# Patient Record
Sex: Female | Born: 1970 | Race: White | Hispanic: No | Marital: Married | Smoking: Current every day smoker
Health system: Southern US, Community
[De-identification: ages and names within clinical notes are randomized; demographics above are authoritative.]

## PROBLEM LIST (undated history)

## (undated) DIAGNOSIS — M1990 Unspecified osteoarthritis, unspecified site: Secondary | ICD-10-CM

## (undated) DIAGNOSIS — C50912 Malignant neoplasm of unspecified site of left female breast: Secondary | ICD-10-CM

## (undated) DIAGNOSIS — R87619 Unspecified abnormal cytological findings in specimens from cervix uteri: Secondary | ICD-10-CM

## (undated) DIAGNOSIS — A609 Anogenital herpesviral infection, unspecified: Secondary | ICD-10-CM

## (undated) DIAGNOSIS — F909 Attention-deficit hyperactivity disorder, unspecified type: Secondary | ICD-10-CM

## (undated) DIAGNOSIS — I471 Supraventricular tachycardia, unspecified: Secondary | ICD-10-CM

## (undated) DIAGNOSIS — M5090 Cervical disc disorder, unspecified, unspecified cervical region: Secondary | ICD-10-CM

## (undated) DIAGNOSIS — C50919 Malignant neoplasm of unspecified site of unspecified female breast: Secondary | ICD-10-CM

## (undated) HISTORY — DX: Cervical disc disorder, unspecified, unspecified cervical region: M50.90

## (undated) HISTORY — DX: Unspecified abnormal cytological findings in specimens from cervix uteri: R87.619

## (undated) HISTORY — DX: Supraventricular tachycardia: I47.1

## (undated) HISTORY — DX: Anogenital herpesviral infection, unspecified: A60.9

## (undated) HISTORY — DX: Supraventricular tachycardia, unspecified: I47.10

---

## 1998-12-02 ENCOUNTER — Other Ambulatory Visit: Admission: RE | Admit: 1998-12-02 | Discharge: 1998-12-02 | Payer: Self-pay | Admitting: Gynecology

## 2000-04-19 ENCOUNTER — Other Ambulatory Visit: Admission: RE | Admit: 2000-04-19 | Discharge: 2000-04-19 | Payer: Self-pay | Admitting: Gynecology

## 2000-05-18 ENCOUNTER — Other Ambulatory Visit: Admission: RE | Admit: 2000-05-18 | Discharge: 2000-05-18 | Payer: Self-pay | Admitting: Gynecology

## 2000-05-18 ENCOUNTER — Encounter (INDEPENDENT_AMBULATORY_CARE_PROVIDER_SITE_OTHER): Payer: Self-pay

## 2000-05-30 ENCOUNTER — Encounter (INDEPENDENT_AMBULATORY_CARE_PROVIDER_SITE_OTHER): Payer: Self-pay

## 2000-05-30 ENCOUNTER — Ambulatory Visit (HOSPITAL_COMMUNITY): Admission: RE | Admit: 2000-05-30 | Discharge: 2000-05-30 | Payer: Self-pay | Admitting: Gynecology

## 2000-07-25 ENCOUNTER — Other Ambulatory Visit: Admission: RE | Admit: 2000-07-25 | Discharge: 2000-07-25 | Payer: Self-pay | Admitting: Gynecology

## 2001-02-20 ENCOUNTER — Other Ambulatory Visit: Admission: RE | Admit: 2001-02-20 | Discharge: 2001-02-20 | Payer: Self-pay | Admitting: Gynecology

## 2001-03-10 ENCOUNTER — Emergency Department (HOSPITAL_COMMUNITY): Admission: EM | Admit: 2001-03-10 | Discharge: 2001-03-10 | Payer: Self-pay | Admitting: Emergency Medicine

## 2001-09-21 ENCOUNTER — Other Ambulatory Visit: Admission: RE | Admit: 2001-09-21 | Discharge: 2001-09-21 | Payer: Self-pay | Admitting: Gynecology

## 2002-10-01 ENCOUNTER — Other Ambulatory Visit: Admission: RE | Admit: 2002-10-01 | Discharge: 2002-10-01 | Payer: Self-pay | Admitting: Gynecology

## 2004-07-25 ENCOUNTER — Emergency Department (HOSPITAL_COMMUNITY): Admission: EM | Admit: 2004-07-25 | Discharge: 2004-07-25 | Payer: Self-pay | Admitting: *Deleted

## 2004-07-26 ENCOUNTER — Emergency Department (HOSPITAL_COMMUNITY): Admission: EM | Admit: 2004-07-26 | Discharge: 2004-07-26 | Payer: Self-pay | Admitting: Emergency Medicine

## 2004-07-28 ENCOUNTER — Emergency Department (HOSPITAL_COMMUNITY): Admission: EM | Admit: 2004-07-28 | Discharge: 2004-07-28 | Payer: Self-pay | Admitting: Family Medicine

## 2005-06-14 DIAGNOSIS — A609 Anogenital herpesviral infection, unspecified: Secondary | ICD-10-CM

## 2005-06-14 HISTORY — DX: Anogenital herpesviral infection, unspecified: A60.9

## 2007-03-07 ENCOUNTER — Other Ambulatory Visit: Admission: RE | Admit: 2007-03-07 | Discharge: 2007-03-07 | Payer: Self-pay | Admitting: Gynecology

## 2007-10-16 ENCOUNTER — Encounter: Admission: RE | Admit: 2007-10-16 | Discharge: 2007-10-16 | Payer: Self-pay | Admitting: Family Medicine

## 2010-10-30 NOTE — Op Note (Signed)
[facility]  Patient:    Heidi Nolan, Heidi Nolan                    MRN: [id number] Adm. 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  POSTOPERATIVE DIAGNOSES 1. Cervical intraepithelial neoplasia, grade 3, extensive at colposcopically    directed biopsies. 2. Closure of incidental incision, lateral vaginal wall.  PROCEDURE:  SURGEON:  Gretta Cool, M.D.  DESCRIPTION OF PROCEDURE:  Under excellent paracervical block anesthesia, with the patient prepped and draped in the lithotomy position, a speculum was placed in the vagina for LEEP cone.  A coated speculum was used so as to prevent inadvertent contact with the electrode.  After carefully outlining the lesion with Lugols iodine and paracervical block, a LEEP cone was obtained. The lateral vaginal wall on the right bulged into the vaginal canal.  It was inadvertently touched with the loop.  The patient then jumped and further extended the entry of the loop into the lateral vaginal wall for a distance of approximately 0.5 cm.  The lateral vaginal wall was then closed with a running suture of 4-0 Vicryl, approximately 0.5 cm deep and 1.0 cm in length. Bleeding was well-controlled.  The LEEP cone bed was then treated with Monsel solution and the cervix bleeding was also well-controlled.  At this point, the procedure was terminated without further complication and patient returned to the recovery room in excellent condition.DD:  05/30/00 TD:  05/31/00 Job: [id number]

## 2012-08-30 ENCOUNTER — Encounter: Payer: Self-pay | Admitting: Obstetrics and Gynecology

## 2012-08-30 ENCOUNTER — Ambulatory Visit (INDEPENDENT_AMBULATORY_CARE_PROVIDER_SITE_OTHER): Payer: 59 | Admitting: Obstetrics and Gynecology

## 2012-08-30 ENCOUNTER — Other Ambulatory Visit: Payer: Self-pay | Admitting: Obstetrics and Gynecology

## 2012-08-30 VITALS — BP 122/80 | Ht 65.0 in | Wt 132.0 lb

## 2012-08-30 DIAGNOSIS — Z0000 Encounter for general adult medical examination without abnormal findings: Secondary | ICD-10-CM

## 2012-08-30 DIAGNOSIS — Z01419 Encounter for gynecological examination (general) (routine) without abnormal findings: Secondary | ICD-10-CM

## 2012-08-30 DIAGNOSIS — A609 Anogenital herpesviral infection, unspecified: Secondary | ICD-10-CM

## 2012-08-30 DIAGNOSIS — B009 Herpesviral infection, unspecified: Secondary | ICD-10-CM

## 2012-08-30 LAB — POCT URINALYSIS DIPSTICK
Bilirubin, UA: NEGATIVE
Protein, UA: NEGATIVE
Urobilinogen, UA: NEGATIVE
pH, UA: 7

## 2012-08-30 LAB — HEMOGLOBIN, FINGERSTICK: Hemoglobin, fingerstick: 12.6 g/dL (ref 12.0–16.0)

## 2012-08-30 LAB — HEMOGLOBIN
HGB: 12.6 g/dL
HGB: 12.6 g/dL

## 2012-08-30 MED ORDER — NORETHINDRONE 0.35 MG PO TABS: 1.0000 | ORAL_TABLET | Freq: Every day | ORAL | Status: DC

## 2012-08-30 MED ORDER — VALACYCLOVIR HCL 500 MG PO TABS: 500.0000 mg | ORAL_TABLET | Freq: Two times a day (BID) | ORAL | Status: DC

## 2012-08-30 NOTE — Progress Notes (Signed)
42 y.o. SingleCaucasian female   G1P1001 here for new gyn exam.  She has not been to a gyn in "a long time" and had some DUB after she had an abortion 2 months ago.  About one month ago, she passed a "piece of tissue" and now she hasn't been having abnormal bleeding.  She started on Lo Loestrin after the abortion but finds it very expensive.  She has a h/o genital HSV, and wold like to restart Valtrex.  Her lesions were originally diagnosed in 2007 by Dr. Nicholas Lose when she had a "rash" on her bottom, and she does have intermittant recurrences of this"rash".      Patient's last menstrual period was 08/13/2012.          Sexually active: yes  The current method of family planning is OCP (estrogen/progesterone).    Exercising: no  none Last mammogram:   Smoking: yes Alcohol: Last colonoscopy: Last Bone Density:    Hgb:                Urine:   reports that she has been smoking Cigarettes.  She has been smoking about 0.00 packs per day. She does not have any smokeless tobacco history on file. She reports that she drinks about 2.5 ounces of alcohol per week. She reports that she does not use illicit drugs.  No health maintenance topics applied.  Family History  Problem Relation Age of Onset  . Osteoarthritis Mother   . Heart attack Father   . Cancer Paternal Grandfather     There is no problem list on file for this patient.   History reviewed. No pertinent past medical history.  Past Surgical History  Procedure Laterality Date  . Cesarean section  1991    Allergies: Review of patient's allergies indicates no known allergies.  Current Outpatient Prescriptions  Medication Sig Dispense Refill  . cetirizine (ZYRTEC) 10 MG tablet Take 10 mg by mouth daily.      Marland Kitchen CHERATUSSIN AC 100-10 MG/5ML syrup       . LO LOESTRIN FE 1 MG-10 MCG / 10 MCG tablet       . VENTOLIN HFA 108 (90 BASE) MCG/ACT inhaler        No current facility-administered medications for this visit.    ROS: Pertinent  items are noted in HPI.  Exam:    BP 122/80  Ht 5\' 5"  (1.651 m)  Wt 132 lb (59.875 kg)  BMI 21.97 kg/m2  LMP 08/13/2012   Wt Readings from Last 3 Encounters:  08/30/12 132 lb (59.875 kg)     Ht Readings from Last 3 Encounters:  08/30/12 5\' 5"  (1.651 m)    General appearance: alert, cooperative and appears stated age Head: Normocephalic, without obvious abnormality, atraumatic Neck: no adenopathy, supple, symmetrical, trachea midline and thyroid not enlarged, symmetric, no tenderness/mass/nodules Lungs: clear to auscultation bilaterally Breasts: Inspection negative, No nipple retraction or dimpling, No nipple discharge or bleeding, No axillary or supraclavicular adenopathy, Normal to palpation without dominant masses Heart: regular rate and rhythm Abdomen: soft, non-tender; bowel sounds normal; no masses,  no organomegaly Extremities: extremities normal, atraumatic, no cyanosis or edema Skin: Skin color, texture, turgor normal. No rashes or lesions Lymph nodes: Cervical, supraclavicular, and axillary nodes normal. No abnormal inguinal nodes palpated Neurologic: Grossly normal   Pelvic: External genitalia:  no lesions              Urethra:  normal appearing urethra with no masses, tenderness or lesions  Bartholins and Skenes: normal                 Vagina: normal appearing vagina with normal color and discharge, no lesions              Cervix: normal appearance              Pap taken: yes        Bimanual Exam:  Uterus:  uterus is normal size, shape, consistency and nontender                                      Adnexa: normal adnexa in size, nontender and no masses                                      Rectovaginal: Confirms                                      Anus:  normal sphincter tone, no lesions  A: well woman      Genital HSV      Smoker, currently on ocp's.     P: mammogram pap smear counseled on breast self exam, mammography screening, use and side  effects of OCP's, family planning choices, adequate intake of calcium and vitamin D, diet and exercise return annually or prn   Discussed that I do NOT recommend ocp's for her d/t smoking and age 46.  Discussed options and pt elects POP.  Fully instructed in [facility]ronor.    An After Visit Summary was printed and given to the patient.

## 2012-08-30 NOTE — Patient Instructions (Signed)
We recommended that you start or continue a regular exercise program for good health. Regular exercise means any activity that makes your heart beat faster and makes you sweat.  We recommend exercising at least 30 minutes per day at least 3 days a week, preferably 4 or 5.  We also recommend a diet low in fat and sugar.  Inactivity, poor dietary choices and obesity can cause diabetes, heart attack, stroke, and kidney damage, among others.    All women over 40 years old should have a yearly mammogram. Many facilities now offer a "3D" mammogram, which may cost around $50 extra out of pocket. If possible,  we recommend you accept the option to have the 3D mammogram performed.  It both reduces the number of women who will be called back for extra views which then turn out to be normal, and it is better than the routine mammogram at detecting truly abnormal areas.    Women should limit their alcohol intake to no more than 7 drinks/beers/glasses of wine (combined, not each!) per week. Moderation of alcohol intake to this level decreases your risk of breast cancer and liver damage. And of course, no recreational drugs are part of a healthy lifestyle.  And absolutely no smoking or even second hand smoke. Most people know smoking can cause heart and lung diseases, but did you know it also contributes to weakening of your bones? Aging of your skin?  Yellowing of your teeth and nails?  Pap smears, to check for cervical cancer or precancers,  have traditionally been done yearly, although recent scientific advances have shown that most women can have pap smears less often.  However, every woman still should have a physical exam from her gynecologist every year. It will include a breast check, inspection of the vulva and vagina to check for abnormal growths or skin changes, a visual exam of the cervix, and then an exam to evaluate the size and shape of the uterus and ovaries.  And after 40 years of age, a rectal exam is  indicated to check for rectal cancers. We will also provide age appropriate advice regarding health maintenance, like when you should have certain vaccines, screening for sexually transmitted diseases, bone density testing, colonoscopy, mammograms, etc.   Adequate intake of calcium and Vitamin D are recommended.  The recommendations for exact amounts of these supplements seem to change often, but generally speaking 600 mg of calcium (either carbonate or citrate) and 800 units of Vitamin D per day seems prudent. Certain women may benefit from higher intake of Vitamin D.  If you are among these women, your doctor will have told you during your visit.     

## 2012-09-15 ENCOUNTER — Encounter: Payer: Self-pay | Admitting: Obstetrics and Gynecology

## 2012-11-17 ENCOUNTER — Other Ambulatory Visit: Payer: Self-pay | Admitting: Orthopedic Surgery

## 2012-11-17 DIAGNOSIS — M542 Cervicalgia: Secondary | ICD-10-CM

## 2012-11-21 ENCOUNTER — Ambulatory Visit
Admission: RE | Admit: 2012-11-21 | Discharge: 2012-11-21 | Disposition: A | Discharge status: Home / Self Care | Payer: 59 | Source: Ambulatory Visit | Attending: Orthopedic Surgery | Admitting: Orthopedic Surgery

## 2012-11-21 VITALS — BP 129/85 | HR 61

## 2012-11-21 DIAGNOSIS — M542 Cervicalgia: Secondary | ICD-10-CM

## 2012-11-21 MED ORDER — IOHEXOL 300 MG/ML  SOLN
1.0000 mL | Freq: Once | INTRAMUSCULAR | Status: AC | PRN
  Administered 2012-11-21: 1 mL via EPIDURAL

## 2012-11-21 MED ORDER — TRIAMCINOLONE ACETONIDE 40 MG/ML IJ SUSP (RADIOLOGY)
60.0000 mg | Freq: Once | INTRAMUSCULAR | Status: AC
  Administered 2012-11-21: 60 mg via EPIDURAL

## 2012-12-27 ENCOUNTER — Telehealth: Payer: Self-pay | Admitting: Obstetrics and Gynecology

## 2012-12-27 NOTE — Telephone Encounter (Signed)
Patient wants to speak with nurse about irregular cycles please.

## 2012-12-27 NOTE — Telephone Encounter (Signed)
Patient states having irregular cycles and BTB with cramping/ Appointment scheduled with Dr,. Romine on Friday,July 18th @ 9:30am/ sue Last AEX 08/2012 with no problems.

## 2012-12-29 ENCOUNTER — Ambulatory Visit (INDEPENDENT_AMBULATORY_CARE_PROVIDER_SITE_OTHER): Payer: 59 | Admitting: Obstetrics and Gynecology

## 2012-12-29 ENCOUNTER — Encounter: Payer: Self-pay | Admitting: Obstetrics and Gynecology

## 2012-12-29 VITALS — BP 104/70 | HR 64 | Temp 98.8°F | Ht 65.0 in | Wt 129.0 lb

## 2012-12-29 DIAGNOSIS — N920 Excessive and frequent menstruation with regular cycle: Secondary | ICD-10-CM

## 2012-12-29 NOTE — Patient Instructions (Signed)
Return if bleeding is heavy or persistant.  Continue routine care next March.

## 2012-12-29 NOTE — Progress Notes (Signed)
42 yo SWF G1P1 seen here in March for her New GYN exam.  At that time, she was changed from COC's to POP's d/t being a smoker and age 53.  She only took the POP's for one month b/c she couldn't afford them.  They were $40 a month! She is not currently sexually active, so doesn't need birth control.  She c/o spotting on and off for the last week.  Since quitting POP's, she has had 3 normal periods, but this month, she is now on d18 and note one week of light spotting, not even enough to use a tampon, but she wears a panty liner.  No concern about STD's.    Exam:  Ext, vag, cx normal.  BM:  Uterus ant, small, mobile , NT.  Adnexa neg.  A:  Single episode of spotting between menses  P:  Reassured that I do not think any intervention is warranted at this point.  Instructed to return if bleeding is persistent or heavy.

## 2013-02-12 HISTORY — PX: NECK SURGERY: SHX720

## 2013-03-01 DIAGNOSIS — M4802 Spinal stenosis, cervical region: Secondary | ICD-10-CM | POA: Insufficient documentation

## 2013-03-09 ENCOUNTER — Encounter: Payer: Self-pay | Admitting: Obstetrics & Gynecology

## 2013-08-31 ENCOUNTER — Ambulatory Visit (INDEPENDENT_AMBULATORY_CARE_PROVIDER_SITE_OTHER): Payer: 59 | Admitting: Obstetrics & Gynecology

## 2013-08-31 ENCOUNTER — Encounter: Payer: Self-pay | Admitting: Obstetrics & Gynecology

## 2013-08-31 ENCOUNTER — Ambulatory Visit: Payer: 59 | Admitting: Obstetrics and Gynecology

## 2013-08-31 VITALS — BP 108/64 | HR 60 | Resp 16 | Ht 65.0 in | Wt 133.4 lb

## 2013-08-31 DIAGNOSIS — B009 Herpesviral infection, unspecified: Secondary | ICD-10-CM

## 2013-08-31 DIAGNOSIS — A609 Anogenital herpesviral infection, unspecified: Secondary | ICD-10-CM

## 2013-08-31 DIAGNOSIS — K625 Hemorrhage of anus and rectum: Secondary | ICD-10-CM

## 2013-08-31 DIAGNOSIS — Z01419 Encounter for gynecological examination (general) (routine) without abnormal findings: Secondary | ICD-10-CM

## 2013-08-31 DIAGNOSIS — Z124 Encounter for screening for malignant neoplasm of cervix: Secondary | ICD-10-CM

## 2013-08-31 MED ORDER — VALACYCLOVIR HCL 500 MG PO TABS: 500.0000 mg | ORAL_TABLET | Freq: Two times a day (BID) | ORAL | Status: DC

## 2013-08-31 NOTE — Patient Instructions (Signed)
Colace (docusate sodium) 100mg  am/pm Miralax--can use daily align

## 2013-08-31 NOTE — Progress Notes (Signed)
43 y.o. G2P1001 SingleCaucasianF here for annual exam.  Started on micronor in July.  Spotting almost daily.  She is really having trouble with taking the pills regularly.  Feels if she wasn't taking the pills she would just cycle regularly.  Having issues with significant constipation.  Can go four or five days without BM.  Often has bright red bleeding after bowel movement.  Does need to strain.  Just had physical with Dr. Wylene Simmerisovec last month.  Lab work was all normal, per patient.  Patient's last menstrual period was 08/12/2013.          Sexually active: yes  The current method of family planning is oral progesterone-only contraceptive.    Exercising: yes  yoga-just started and walking Smoker:  Yes one pack/week  Health Maintenance: Pap:  08/30/12 WNL/negative HR HPV History of abnormal Pap:  yes MMG:  none Colonoscopy:  none BMD:   none TDaP:  Up to date Screening Labs: PCP, Hb today: PCP, Urine today: PCP   reports that she has been smoking Cigarettes.  She has been smoking about 0.00 packs per day. She has never used smokeless tobacco. She reports that she drinks about 2.5 ounces of alcohol per week. She reports that she does not use illicit drugs.  Past Medical History  Diagnosis Date  . HSV (herpes simplex virus) anogenital infection 2007  . Abnormal Pap smear of cervix     Past Surgical History  Procedure Laterality Date  . Cesarean section  1991  . Neck surgery  9/14    Current Outpatient Prescriptions  Medication Sig Dispense Refill  . cetirizine (ZYRTEC) 10 MG tablet Take 10 mg by mouth daily.      . ORTHO MICRONOR 0.35 MG tablet       . valACYclovir (VALTREX) 500 MG tablet Take 1 tablet (500 mg total) by mouth 2 (two) times daily. Take as directed.  12 tablet  1  . VENTOLIN HFA 108 (90 BASE) MCG/ACT inhaler        No current facility-administered medications for this visit.    Family History  Problem Relation Age of Onset  . Osteoarthritis Mother   . Heart  attack Father   . Cancer Paternal Grandfather     ROS:  Pertinent items are noted in HPI.  Otherwise, a comprehensive ROS was negative.  Exam:   BP 108/64  Pulse 60  Resp 16  Ht 5\' 5"  (1.651 m)  Wt 133 lb 6.4 oz (60.51 kg)  BMI 22.20 kg/m2  LMP 08/12/2013  Height: 5\' 5"  (165.1 cm)  Ht Readings from Last 3 Encounters:  08/31/13 5\' 5"  (1.651 m)  12/29/12 5\' 5"  (1.651 m)  08/30/12 5\' 5"  (1.651 m)    General appearance: alert, cooperative and appears stated age Head: Normocephalic, without obvious abnormality, atraumatic Neck: no adenopathy, supple, symmetrical, trachea midline and thyroid normal to inspection and palpation Lungs: clear to auscultation bilaterally Breasts: normal appearance, no masses or tenderness Heart: regular rate and rhythm Abdomen: soft, non-tender; bowel sounds normal; no masses,  no organomegaly Extremities: extremities normal, atraumatic, no cyanosis or edema Skin: Skin color, texture, turgor normal. No rashes or lesions Lymph nodes: Cervical, supraclavicular, and axillary nodes normal. No abnormal inguinal nodes palpated Neurologic: Grossly normal   Pelvic: External genitalia:  no lesions              Urethra:  normal appearing urethra with no masses, tenderness or lesions  Bartholins and Skenes: normal                 Vagina: normal appearing vagina with normal color and discharge, no lesions              Cervix: no lesions              Pap taken: yes Bimanual Exam:  Uterus:  normal size, contour, position, consistency, mobility, non-tender              Adnexa: normal adnexa and no mass, fullness, tenderness               Rectovaginal: Confirms               Anus:  normal sphincter tone, no lesions  A:  Well Woman with normal exam DUB on micronor Constipation, rectal bleeding Smoker H/O HSV  P:   Mammogram yearly.  Information given to start screening now.  Pt states she will schedule pap smear only obtained Stop micronor.  Info  about ablation and IUD given.  Pt will use condoms until decides.  If irregular bleeding continues after stopping micronor, she will call and PUS will be scheduled. GI referral.  Colace and Miralax and Align recommended. Valtrex 500mg  daily rx to pharmacy. return annually or prn  An After Visit Summary was printed and given to the patient.

## 2013-09-05 LAB — IPS PAP SMEAR ONLY

## 2013-09-28 ENCOUNTER — Telehealth: Payer: Self-pay | Admitting: Obstetrics & Gynecology

## 2013-09-28 NOTE — Telephone Encounter (Signed)
Left message for patient to call back. Need to advise patient that she is scheduled with Dr Loreta AveMann 04.30.2015 @ 1330.

## 2013-10-16 ENCOUNTER — Other Ambulatory Visit: Payer: Self-pay

## 2013-10-16 DIAGNOSIS — Z1231 Encounter for screening mammogram for malignant neoplasm of breast: Secondary | ICD-10-CM

## 2013-10-22 ENCOUNTER — Ambulatory Visit
Admission: RE | Admit: 2013-10-22 | Discharge: 2013-10-22 | Disposition: A | Discharge status: Home / Self Care | Payer: 59 | Source: Ambulatory Visit

## 2013-10-22 DIAGNOSIS — Z1231 Encounter for screening mammogram for malignant neoplasm of breast: Secondary | ICD-10-CM

## 2013-11-29 ENCOUNTER — Encounter: Payer: Self-pay | Admitting: Cardiology

## 2014-04-15 ENCOUNTER — Encounter: Payer: Self-pay | Admitting: Obstetrics & Gynecology

## 2014-08-08 ENCOUNTER — Encounter: Payer: Self-pay | Admitting: Cardiology

## 2014-08-16 ENCOUNTER — Telehealth: Payer: Self-pay | Admitting: Obstetrics & Gynecology

## 2014-08-16 NOTE — Telephone Encounter (Signed)
Spoke with patient. Appointment scheduled for 3/8 at 2pm with Dr.Miller (date and time per Kennon RoundsSally). Patient is agreeable to date and time.   Routing to provider for final review. Patient agreeable to disposition. Will close encounter

## 2014-08-16 NOTE — Telephone Encounter (Signed)
Pt hasn't been seen in almost a year.  We should see first before referring to another GI.

## 2014-08-16 NOTE — Telephone Encounter (Signed)
Spoke with patient. Patient states "At my last annual I was having rectal bleeding that was coming from a tear. Dr.Miller referred me to a GI. I think Dr.Mann. Who I saw and was given some cream. I guess they thought it would heal on its own but it has gotten worse. Every time I use the restroom it is terrible and then I am in pain the rest of the day. I did not like the provider I saw before so I did not go back. Is there someone else she could refer me over to?" Advised patient will need to speak with Dr.Miller regarding referral and return call. Patient was last seen for aex on 08/31/2013. Next annual is scheduled for 09/09/2014. Patient also requesting rx for Valtrex be sent to [facility] on file. "I have decided that I would like to take the Valtrex daily now." Patient denies increase in outbreaks at this time. "I just prefer to take it daily." Was last given Valtrex 500mg  bid #6 3RF at last annual.

## 2014-08-16 NOTE — Telephone Encounter (Signed)
Left message to call Breannah Kratt at [phone].   Need to offer patient appointment for Tuesday 3/8 at 2pm with Dr.Miller (day and time per Kennon RoundsSally).

## 2014-08-16 NOTE — Telephone Encounter (Signed)
Patient wants to schedule an appointment for a "tear down there." She declined to give more information until speaking with the nurse.

## 2014-08-20 ENCOUNTER — Telehealth: Payer: Self-pay | Admitting: Obstetrics & Gynecology

## 2014-08-20 ENCOUNTER — Ambulatory Visit (INDEPENDENT_AMBULATORY_CARE_PROVIDER_SITE_OTHER): Payer: 59 | Admitting: Obstetrics & Gynecology

## 2014-08-20 VITALS — BP 126/78 | HR 60 | Resp 16 | Wt 137.2 lb

## 2014-08-20 DIAGNOSIS — K6289 Other specified diseases of anus and rectum: Secondary | ICD-10-CM

## 2014-08-20 DIAGNOSIS — A609 Anogenital herpesviral infection, unspecified: Secondary | ICD-10-CM | POA: Diagnosis not present

## 2014-08-20 DIAGNOSIS — K602 Anal fissure, unspecified: Secondary | ICD-10-CM

## 2014-08-20 DIAGNOSIS — K629 Disease of anus and rectum, unspecified: Secondary | ICD-10-CM

## 2014-08-20 MED ORDER — LIDOCAINE 5 % EX OINT: 1.0000 "application " | TOPICAL_OINTMENT | Freq: Four times a day (QID) | CUTANEOUS | Status: DC | PRN

## 2014-08-20 MED ORDER — VALACYCLOVIR HCL 500 MG PO TABS: 500.0000 mg | ORAL_TABLET | Freq: Every day | ORAL | Status: DC

## 2014-08-20 NOTE — Progress Notes (Signed)
Subjective:     Patient ID: Heidi Nolan, female   DOB: 1970/11/17, 44 y.o.   MRN: [id number]  HPI 44 yo G2P1 SWF here for discussion of increase pain with bowel movements.  Known hx of anal fissure, for which she has seen Dr. Loreta Nolan.  Pt hasn't seen her in several years and called for a referral back to Dr. Loreta Nolan.  Does have an old rx of cardizem topical gel to use on the fissure.  Has been using it but it is so painful she doesn't think she is getting it where it needs to be.  Also, has hx of chronic constipation but doesn't usually take anything for pain.  Not seeing any blood right now.  No fevers.  No recent change in bowel movement.  Another question is in regards to her valtrex.  She is only using is for outbreaks but is in a new relationship and would like to start it daily to decrease risk for partner.  Agree with suggestion.  Will change pt to 500mg  daily dosage.  Review of Systems  Gastrointestinal: Positive for constipation and rectal pain.  All other systems reviewed and are negative.      Objective:   Physical Exam  Constitutional: She is oriented to person, place, and time. She appears well-developed and well-nourished.  Abdominal: Soft. Bowel sounds are normal.  Genitourinary: Vagina normal. Rectal exam shows fissure (12-1 o'clock, extremely tender) and tenderness. Rectal exam shows no internal hemorrhoid, no mass and anal tone normal. There is no rash, tenderness, lesion or injury on the right labia. There is no rash, tenderness, lesion or injury on the left labia.  Lymphadenopathy:       Right: No inguinal adenopathy present.       Left: No inguinal adenopathy present.  Neurological: She is alert and oriented to person, place, and time.  Skin: Skin is warm and dry.  Psychiatric: She has a normal mood and affect.       Assessment:     Anal fissure Rectal pain with defecation  H/o HSV 2     Plan:    Lidocaine 5% ointment before Cardizem application.  Rx for  Cardizem gel given as well according to dosage and instructions on bottle from Dr. Loreta Nolan.  Appropriate location for placement in rectum given.  Pt will give update in a month.  If worsens, she is advised to call back sooner so I can get her back to see Dr. Loreta Nolan.   May ultimately need referral back to Dr. Loreta Nolan.  Will wait and see how much improvement she gets.   Desires to start suppressive therapy.  Valtrex 500mg  daily with increasing to BID x 3 days with any outbreaks  Suggestions for constipation made as well with OTC products

## 2014-08-20 NOTE — Telephone Encounter (Signed)
Spoke with Heidi Nolan at [facility]. She states Lidocaine comes in tubes of 30 grams only, no 1.25 gram tube available. Advised 30 grams to be dispensed is okay.  Will now dispense to patient.   Routing to provider for final review. Patient agreeable to disposition. Will close encounter

## 2014-08-20 NOTE — Telephone Encounter (Signed)
Pharmacy calling for clarification on Lidocaine Rx.

## 2014-08-20 NOTE — Patient Instructions (Signed)
Use topical lidocaine ointment 5% 4-5 times daily as needed.  Apply a few minutes before the cardizem.    Stool softeners:  Colace (docusate sodium).  Can take 2 in am and 2 in pm.   Add miralax to this.

## 2014-08-26 ENCOUNTER — Encounter: Payer: Self-pay | Admitting: Obstetrics & Gynecology

## 2014-09-01 ENCOUNTER — Encounter: Payer: Self-pay | Admitting: Obstetrics & Gynecology

## 2014-09-01 DIAGNOSIS — K602 Anal fissure, unspecified: Secondary | ICD-10-CM | POA: Insufficient documentation

## 2014-09-01 DIAGNOSIS — A609 Anogenital herpesviral infection, unspecified: Secondary | ICD-10-CM | POA: Insufficient documentation

## 2014-09-09 ENCOUNTER — Ambulatory Visit: Payer: 59 | Admitting: Obstetrics & Gynecology

## 2014-09-13 ENCOUNTER — Encounter: Payer: Self-pay | Admitting: Obstetrics & Gynecology

## 2014-09-13 ENCOUNTER — Ambulatory Visit (INDEPENDENT_AMBULATORY_CARE_PROVIDER_SITE_OTHER): Payer: 59 | Admitting: Obstetrics & Gynecology

## 2014-09-13 VITALS — BP 100/72 | HR 70 | Ht 65.0 in | Wt 134.8 lb

## 2014-09-13 DIAGNOSIS — Z01419 Encounter for gynecological examination (general) (routine) without abnormal findings: Secondary | ICD-10-CM | POA: Diagnosis not present

## 2014-09-13 DIAGNOSIS — K602 Anal fissure, unspecified: Secondary | ICD-10-CM

## 2014-09-13 DIAGNOSIS — A609 Anogenital herpesviral infection, unspecified: Secondary | ICD-10-CM | POA: Diagnosis not present

## 2014-09-13 MED ORDER — NORETHINDRONE 0.35 MG PO TABS: 1.0000 | ORAL_TABLET | Freq: Every day | ORAL | Status: DC

## 2014-09-13 NOTE — Progress Notes (Signed)
Patient is scheduled for office visit with Dr. Loreta AveMann for 09/24/14 at  1500 (patient choice for date, will be out of town on 7th and 8th). Patient agreeable to appointment.

## 2014-09-13 NOTE — Progress Notes (Deleted)
44 y.o. G2P1001 Single{Race/ethnicity:17218}F here for annual exam.    Patient's last menstrual period was 08/13/2014.          Sexually active: {yes no:314532}  The current method of family planning is {contraception:315051}.    Exercising: {yes no:314532}  {types:19826} Smoker:  {YES NO:22349}  Health Maintenance: Pap:  *** History of abnormal Pap:  {YES NO:22349} MMG:  *** Colonoscopy:  *** BMD:   *** TDaP:  *** Screening Labs: ***, Hb today: ***, Urine today: ***   reports that she has been smoking Cigarettes.  She has never used smokeless tobacco. She reports that she drinks about 2.5 - 3.5 oz of alcohol per week. She reports that she does not use illicit drugs.  Past Medical History  Diagnosis Date  . HSV (herpes simplex virus) anogenital infection 2007  . Abnormal Pap smear of cervix     Past Surgical History  Procedure Laterality Date  . Cesarean section  1991  . Neck surgery  9/14    [facility]    Current Outpatient Prescriptions  Medication Sig Dispense Refill  . cetirizine (ZYRTEC) 10 MG tablet Take 10 mg by mouth daily.    Marland Kitchen. lidocaine (XYLOCAINE) 5 % ointment Apply 1 application topically 4 (four) times daily as needed. 1.25 g 2  . valACYclovir (VALTREX) 500 MG tablet Take 1 tablet (500 mg total) by mouth daily. Take as directed. 90 tablet 4  . VENTOLIN HFA 108 (90 BASE) MCG/ACT inhaler      No current facility-administered medications for this visit.    Family History  Problem Relation Age of Onset  . Osteoarthritis Mother   . Heart attack Father   . Cancer Paternal Grandfather     ROS:  Pertinent items are noted in HPI.  Otherwise, a comprehensive ROS was negative.  Exam:   BP 100/72 mmHg  Pulse 70  Ht 5\' 8"  (1.727 m)  Wt 134 lb 12.8 oz (61.145 kg)  BMI 20.50 kg/m2  LMP 08/13/2014  Weight change: @WEIGHTCHANGE @ Height:   Height: 5\' 8"  (172.7 cm)  Ht Readings from Last 3 Encounters:  09/13/14 5\' 8"  (1.727 m)  08/31/13 5\' 5"  (1.651 m)  12/29/12 5\' 5"   (1.651 m)    General appearance: alert, cooperative and appears stated age Head: Normocephalic, without obvious abnormality, atraumatic Neck: no adenopathy, supple, symmetrical, trachea midline and thyroid {EXAM; THYROID:18604} Lungs: clear to auscultation bilaterally Breasts: {Exam; breast:13139::"normal appearance, no masses or tenderness"} Heart: regular rate and rhythm Abdomen: soft, non-tender; bowel sounds normal; no masses,  no organomegaly Extremities: extremities normal, atraumatic, no cyanosis or edema Skin: Skin color, texture, turgor normal. No rashes or lesions Lymph nodes: Cervical, supraclavicular, and axillary nodes normal. No abnormal inguinal nodes palpated Neurologic: Grossly normal   Pelvic: External genitalia:  no lesions              Urethra:  normal appearing urethra with no masses, tenderness or lesions              Bartholins and Skenes: normal                 Vagina: normal appearing vagina with normal color and discharge, no lesions              Cervix: {exam; cervix:14595}              Pap taken: {yes no:314532} Bimanual Exam:  Uterus:  {exam; uterus:12215}              Adnexa: {  exam; adnexa:12223}               Rectovaginal: Confirms               Anus:  normal sphincter tone, no lesions  Chaperone was present for exam.  A:  Well Woman with normal exam  P:   {plan; gyn:5269::"mammogram","pap smear","return annually or prn"}

## 2014-09-13 NOTE — Progress Notes (Signed)
44 y.o. G2P1001 SingleCaucasianF here for annual exam.  Still having rectal pain with fissure.  Pt has been using Diltiazem and lidocaine without a lot of improvement in the last three week.  Pt reports having really bad episode of constipation last week and she thought she was going to have to go to the ER for pain.    Cycles normal.  Not on Micronor any longer.    PCP:  Dr. Wylene Simmerisovec.  Saw him about a month ago.  Blood work was all normal.    Patient's last menstrual period was 08/13/2014.          Sexually active: Yes.    The current method of family planning is none.    Exercising: Yes.    walking 3-4/xwk Smoker:  Yes, 1 pack/wk  Health Maintenance: Pap:  08/31/13 wnl  History of abnormal Pap:  yes MMG:  10/23/2013 Colonoscopy:  None  BMD:  None  TDaP: up to date Screening Labs:, Labs done by Filiberto Pinksichard Tisvoec, MD , Urine today: Declined   reports that she has been smoking Cigarettes.  She has never used smokeless tobacco. She reports that she drinks about 2.5 - 3.5 oz of alcohol per week. She reports that she does not use illicit drugs.  Past Medical History  Diagnosis Date  . HSV (herpes simplex virus) anogenital infection 2007  . Abnormal Pap smear of cervix     Past Surgical History  Procedure Laterality Date  . Cesarean section  1991  . Neck surgery  9/14    [facility]    Current Outpatient Prescriptions  Medication Sig Dispense Refill  . cetirizine (ZYRTEC) 10 MG tablet Take 10 mg by mouth daily.    Marland Kitchen. lidocaine (XYLOCAINE) 5 % ointment Apply 1 application topically 4 (four) times daily as needed. 1.25 g 2  . valACYclovir (VALTREX) 500 MG tablet Take 1 tablet (500 mg total) by mouth daily. Take as directed. 90 tablet 4  . VENTOLIN HFA 108 (90 BASE) MCG/ACT inhaler      No current facility-administered medications for this visit.    Family History  Problem Relation Age of Onset  . Osteoarthritis Mother   . Heart attack Father   . Cancer Paternal Grandfather     ROS:   Pertinent items are noted in HPI.  Otherwise, a comprehensive ROS was negative.  Exam:   BP 100/72 mmHg  Pulse 70  Ht 5\' 5"  (1.651 m)  Wt 134 lb 12.8 oz (61.145 kg)  BMI 22.43 kg/m2  LMP 08/13/2014   Height: 5\' 5"  (165.1 cm)  Ht Readings from Last 3 Encounters:  09/13/14 5\' 5"  (1.651 m)  08/31/13 5\' 5"  (1.651 m)  12/29/12 5\' 5"  (1.651 m)    General appearance: alert, cooperative and appears stated age Head: Normocephalic, without obvious abnormality, atraumatic Neck: no adenopathy, supple, symmetrical, trachea midline and thyroid normal to inspection and palpation Lungs: clear to auscultation bilaterally Breasts: normal appearance, no masses or tenderness Heart: regular rate and rhythm Abdomen: soft, non-tender; bowel sounds normal; no masses,  no organomegaly Extremities: extremities normal, atraumatic, no cyanosis or edema Skin: Skin color, texture, turgor normal. No rashes or lesions Lymph nodes: Cervical, supraclavicular, and axillary nodes normal. No abnormal inguinal nodes palpated Neurologic: Grossly normal   Pelvic: External genitalia:  no lesions              Urethra:  normal appearing urethra with no masses, tenderness or lesions  Bartholins and Skenes: normal                 Vagina: normal appearing vagina with normal color and discharge, no lesions              Cervix: no lesions              Pap taken: No. Bimanual Exam:  Uterus:  normal size, contour, position, consistency, mobility, non-tender              Adnexa: normal adnexa and no mass, fullness, tenderness               Rectovaginal: Confirms               Anus:  normal sphincter tone, no lesions  Chaperone was present for exam.  A:  Well Woman with normal exam Using no contraception Stopped micronor due to DUB but desires to restart Anal fissure, not improved with diltiazem and lidocaine for last three weeks Smoker H/O HSV  P: Mammogram recommedations discussed with pt.  Pt  will do MMG again next year. Pap with neg HR HPV 2014.  Neg pap 2015.  No pap today. Micronor rx given.  Pt will start with next cycle.  IUD information provided as well On Valtrex  daily.  Rx given three weeks ago for the year. Refer back to GI for anal fissure issues return annually or prn

## 2014-11-04 ENCOUNTER — Ambulatory Visit: Payer: 59 | Admitting: Certified Nurse Midwife

## 2015-02-14 ENCOUNTER — Encounter: Payer: Self-pay | Admitting: Obstetrics & Gynecology

## 2015-11-21 ENCOUNTER — Ambulatory Visit: Payer: 59 | Admitting: Obstetrics & Gynecology

## 2015-12-04 ENCOUNTER — Encounter: Payer: Self-pay | Admitting: Obstetrics & Gynecology

## 2016-06-10 ENCOUNTER — Encounter: Payer: Self-pay | Admitting: Obstetrics & Gynecology

## 2018-03-28 ENCOUNTER — Encounter: Payer: Self-pay | Admitting: Cardiology

## 2018-03-28 ENCOUNTER — Encounter (HOSPITAL_COMMUNITY): Payer: Self-pay | Admitting: *Deleted

## 2018-03-28 ENCOUNTER — Emergency Department (HOSPITAL_COMMUNITY): Payer: BLUE CROSS/BLUE SHIELD

## 2018-03-28 ENCOUNTER — Other Ambulatory Visit: Payer: Self-pay

## 2018-03-28 ENCOUNTER — Emergency Department (HOSPITAL_BASED_OUTPATIENT_CLINIC_OR_DEPARTMENT_OTHER): Payer: BLUE CROSS/BLUE SHIELD

## 2018-03-28 ENCOUNTER — Emergency Department (HOSPITAL_COMMUNITY)
Admission: EM | Admit: 2018-03-28 | Discharge: 2018-03-28 | Disposition: A | Discharge status: Home / Self Care | Payer: BLUE CROSS/BLUE SHIELD | Attending: Emergency Medicine | Admitting: Emergency Medicine

## 2018-03-28 DIAGNOSIS — R52 Pain, unspecified: Secondary | ICD-10-CM

## 2018-03-28 DIAGNOSIS — Z79899 Other long term (current) drug therapy: Secondary | ICD-10-CM | POA: Diagnosis not present

## 2018-03-28 DIAGNOSIS — R079 Chest pain, unspecified: Secondary | ICD-10-CM

## 2018-03-28 DIAGNOSIS — M79606 Pain in leg, unspecified: Secondary | ICD-10-CM | POA: Diagnosis not present

## 2018-03-28 DIAGNOSIS — F17210 Nicotine dependence, cigarettes, uncomplicated: Secondary | ICD-10-CM | POA: Diagnosis not present

## 2018-03-28 DIAGNOSIS — Z7982 Long term (current) use of aspirin: Secondary | ICD-10-CM | POA: Diagnosis not present

## 2018-03-28 DIAGNOSIS — R0789 Other chest pain: Secondary | ICD-10-CM | POA: Diagnosis not present

## 2018-03-28 LAB — BASIC METABOLIC PANEL
ANION GAP: 6 (ref 5–15)
BUN: 8 mg/dL (ref 6–20)
CHLORIDE: 105 mmol/L (ref 98–111)
CO2: 25 mmol/L (ref 22–32)
Calcium: 9.4 mg/dL (ref 8.9–10.3)
Creatinine, Ser: 0.58 mg/dL (ref 0.44–1.00)
GFR calc Af Amer: >60 mL/min (ref >60)
GFR calc non Af Amer: >60 mL/min (ref >60)
GLUCOSE: 113 mg/dL — AB (ref 70–99)
POTASSIUM: 4.9 mmol/L (ref 3.5–5.1)
Sodium: 136 mmol/L (ref 135–145)

## 2018-03-28 LAB — CBC
HCT: 43.4 % (ref 36.0–46.0)
HEMOGLOBIN: 13.8 g/dL (ref 12.0–15.0)
MCH: 30.7 pg (ref 26.0–34.0)
MCHC: 31.8 g/dL (ref 30.0–36.0)
MCV: 96.4 fL (ref 80.0–100.0)
Platelets: 313 10*3/uL (ref 150–400)
RBC: 4.5 MIL/uL (ref 3.87–5.11)
RDW: 13.4 % (ref 11.5–15.5)
WBC: 4.6 10*3/uL (ref 4.0–10.5)
nRBC: 0 % (ref 0.0–0.2)

## 2018-03-28 LAB — I-STAT BETA HCG BLOOD, ED (MC, WL, AP ONLY): I-stat hCG, quantitative: <5 m[IU]/mL (ref <5)

## 2018-03-28 LAB — I-STAT TROPONIN, ED: Troponin i, poc: 0 ng/mL (ref 0.00–0.08)

## 2018-03-28 NOTE — ED Provider Notes (Signed)
[facility] EMERGENCY DEPARTMENT Provider Note   CSN: [id number] Arrival date & time: 03/28/18  1105     History   Chief Complaint Chief Complaint  Patient presents with  . Leg Pain  . Chest Pain    HPI Heidi Nolan is a 47 y.o. female.  The history is provided by the patient and medical records. No language interpreter was used.  Leg Pain    Chest Pain   Associated symptoms include leg pain and nausea. Pertinent negatives include no abdominal pain, no palpitations and no vomiting.   Heidi Nolan is a 47 y.o. female who presents to the Emergency Department complaining of central, sharp shooting chest pain x 4 days. Radiates to the left breast. No alleviating or aggravating factors noted. No medications taken prior to arrival for symptoms. Pain will occur spontaneously every hour or so. No exertional component. Will have associated nausea. No hx of HTN, HLD, DM. Not on OCP's. No longer travel / surgeries / immobilizations. She has been experiencing left thigh pain for about a month as well. No inciting event. No leg swelling or overlying skin changed.   Past Medical History:  Diagnosis Date  . Abnormal Pap smear of cervix   . HSV (herpes simplex virus) anogenital infection 2007    Patient Active Problem List   Diagnosis Date Noted  . HSV (herpes simplex virus) anogenital infection 09/01/2014  . Rectal fissure 09/01/2014  . Cervical spinal stenosis 03/01/2013    Past Surgical History:  Procedure Laterality Date  . CESAREAN SECTION  1991  . NECK SURGERY  9/14   [facility]     OB History    Gravida  2   Para  1   Term  1   Preterm      AB      Living  1     SAB      TAB      Ectopic      Multiple      Live Births               Home Medications    Prior to Admission medications   Medication Sig Start Date End Date Taking? Authorizing Provider  aspirin EC 81 MG tablet Take 81 mg by mouth daily.   Yes [provider]  cetirizine (ZYRTEC) 10 MG tablet Take 10 mg by mouth daily.   Yes [provider]  montelukast (SINGULAIR) 10 MG tablet Take 10 mg by mouth daily. 12/26/17  Yes [provider]  VENTOLIN HFA 108 (90 BASE) MCG/ACT inhaler Inhale 2 puffs into the lungs every 4 (four) hours as needed for wheezing.  08/15/12  Yes [provider]  lidocaine (XYLOCAINE) 5 % ointment Apply 1 application topically 4 (four) times daily as needed. Patient not taking: Reported on 03/28/2018 08/20/14   Jerene Bears, MD  norethindrone (MICRONOR,CAMILA,ERRIN) 0.35 MG tablet Take 1 tablet (0.35 mg total) by mouth daily. Patient not taking: Reported on 03/28/2018 09/13/14   Jerene Bears, MD  valACYclovir (VALTREX) 500 MG tablet Take 1 tablet (500 mg total) by mouth daily. Take as directed. Patient not taking: Reported on 03/28/2018 08/20/14   Jerene Bears, MD    Family History Family History  Problem Relation Age of Onset  . Osteoarthritis Mother   . Heart attack Father   . Cancer Paternal Grandfather     Social History Social History   Tobacco Use  . Smoking status: Light  Tobacco Smoker    Types: Cigarettes  . Smokeless tobacco: Never Used  . Tobacco comment: one pack/week  Substance Use Topics  . Alcohol use: Yes    Alcohol/week: 5.0 - 7.0 standard drinks    Types: 5 - 7 Standard drinks or equivalent per week    Comment: 1 glass of wine at night  . Drug use: No     Allergies   Patient has no known allergies.   Review of Systems Review of Systems  Cardiovascular: Positive for chest pain. Negative for palpitations and leg swelling.  Gastrointestinal: Positive for nausea. Negative for abdominal pain and vomiting.  Musculoskeletal: Positive for myalgias.  All other systems reviewed and are negative.    Physical Exam Updated Vital Signs BP (!) 142/73   Pulse 63   Temp 98.9 F (37.2 C) (Oral)   Resp 16   Ht 5\' 5"  (1.651 m)   Wt 62.1 kg   LMP 03/23/2018  (Approximate)   SpO2 100%   BMI 22.80 kg/m   Physical Exam  Constitutional: She is oriented to person, place, and time. She appears well-developed and well-nourished. No distress.  HENT:  Head: Normocephalic and atraumatic.  Cardiovascular: Normal rate, regular rhythm and normal heart sounds.  No murmur heard. Pulmonary/Chest: Effort normal and breath sounds normal. No respiratory distress.  Abdominal: Soft. She exhibits no distension. There is no tenderness.  Musculoskeletal: She exhibits no edema.  5/5 muscle strength and full ROM to lower extremities.  Diffuse tenderness to left lateral thigh without overlying skin changes. No calf tenderness or leg swelling.  Neurological: She is alert and oriented to person, place, and time.  Skin: Skin is warm and dry.  Nursing note and vitals reviewed.    ED Treatments / Results  Labs (all labs ordered are listed, but only abnormal results are displayed) Labs Reviewed  BASIC METABOLIC PANEL - Abnormal; Notable for the following components:      Result Value   Glucose, Bld 113 (*)    All other components within normal limits  CBC  I-STAT TROPONIN, ED  I-STAT BETA HCG BLOOD, ED ([facility], [facility], [facility] ONLY)    EKG EKG Interpretation  Date/Time:  Tuesday March 28 2018 11:10:05 EDT Ventricular Rate:  66 PR Interval:  124 QRS Duration: 92 QT Interval:  404 QTC Calculation: 423 R Axis:   88 Text Interpretation:  Normal sinus rhythm Normal ECG Confirmed by Benjiman Core [phone]) on 03/28/2018 12:59:21 PM   Radiology Dg Chest 2 View  Result Date: 03/28/2018 CLINICAL DATA:  Chest tightness.  History of smoking. EXAM: CHEST - 2 VIEW COMPARISON:  Oct 16, 2007 FINDINGS: The heart size and mediastinal contours are within normal limits. Both lungs are clear. The visualized skeletal structures are unremarkable. IMPRESSION: No active cardiopulmonary disease. Electronically Signed   By: Gerome Sam III M.D   On: 03/28/2018 11:47     Procedures Procedures (including critical care time)  Medications Ordered in ED Medications - No data to display   Initial Impression / Assessment and Plan / ED Course  I have reviewed the triage vital signs and the nursing notes.  Pertinent labs & imaging results that were available during my care of the patient were reviewed by me and considered in my medical decision making (see chart for details).    Heidi Nolan is a 47 y.o. female who presents to ED for chest pain associated with nausea x 4 days. On exam, patient is afebrile, hemodynamically stable with normal  cardiopulmonary exam.   Labs reviewed and reassuring with negative troponin. CXR with no acute abnormalities.  EKG non-ischemic.   She has also been experiencing left thigh pain x 1 month. LE doppler negative for DVT. She is perc negative. Doubt PE as cause for chest pain. Low risk heart score of 2.   Patient's symptoms unlikely to be of cardiac etiology. Labs and imaging reviewed again prior to discharge. Patient has been advised to return to the ED if development of any exertional chest pain, trouble breathing, new/worsening symptoms or for any additional concerns. Evaluation does not show pathology that would require ongoing emergent intervention or inpatient treatment. Encouraged to follow up with cardiology. Referral information included in discharge paperwork. Patient understands return precautions and follow up plan. All questions answered.   Final Clinical Impressions(s) / ED Diagnoses   Final diagnoses:  Chest pain with low risk for cardiac etiology    ED Discharge Orders    None       Alicia Seib, Chase Picket, PA-C 03/28/18 1519    Benjiman Core, MD 03/28/18 [phone]

## 2018-03-28 NOTE — Progress Notes (Signed)
LLE venous duplex prelim: negative for DVT. Raini Tiley Eunice, RDMS, RVT  

## 2018-03-28 NOTE — ED Notes (Signed)
Patient transported to Ultrasound 

## 2018-03-28 NOTE — ED Triage Notes (Signed)
Pt sent by PCP for eval d/t Mid radiating CP to L arm onset x4 days with intermittent diaphoresis and L neck pain, reports SOB with episodes, denies current  Pain, denies v/d, c/o nausea, reports having L leg pain onset at the same time of CP symptoms, A&O x4

## 2018-03-28 NOTE — Discharge Instructions (Signed)
It was my pleasure taking care of you today!   Please call the cardiology clinic listed today or tomorrow to schedule a follow up appointment.   Return to ER for new or worsening symptoms, any additional concerns.

## 2018-04-13 NOTE — Progress Notes (Signed)
Cardiology Office Note   Date:  04/14/2018   ID:  Heidi Nolan, DOB 1970-10-14, MRN [id number]  PCP:  [facility] [place]  Cardiologist:   No primary care provider on file. Referring:  ED  Chief Complaint  Patient presents with  . Chest Pain      History of Present Illness: Heidi Nolan is a 47 y.o. female who is referred by the ED for evaluation of chest pain.    She was in the ED on the 15 th of October with chest pain.  I reviewed these records for this visit.    There was no objective evidence of ischemia.  She had thigh pain but a Doppler was negative for DVT.  She has had the pain a couple days prior to this and went to see her primary care doctor the next day and was told that her EKG was abnormal though I do not have a copy of this.  In the emergency room the EKG was normal.  Enzymes are normal.  There was no objective evidence of ischemia.  Her symptoms have since resolved.  She said she was having upper mid sternal discomfort that was also associated with a little bit of neck discomfort on the left side.  There was no associated arm pain.  She was may be a little short of breath.  She is a little nauseated.  She was slightly dizzy.  She did not have any frank diaphoresis.  It was at rest.  It would be 8 out of 10 in intensity for brief periods maybe 30 seconds or a minute or so and then it would go away.  It seemed to happen on and off throughout Sunday.  It was not positional.  She could not make it come on.  She never had this before.  Past Medical History:  Diagnosis Date  . Abnormal Pap smear of cervix   . Cervical disc disease   . HSV (herpes simplex virus) anogenital infection 2007    Past Surgical History:  Procedure Laterality Date  . CESAREAN SECTION  1991  . NECK SURGERY  9/14   [facility]     Current Outpatient Medications  Medication Sig Dispense Refill  . cetirizine (ZYRTEC) 10 MG tablet Take 10 mg by mouth daily.    .  montelukast (SINGULAIR) 10 MG tablet Take 10 mg by mouth daily.  3  . VENTOLIN HFA 108 (90 BASE) MCG/ACT inhaler Inhale 2 puffs into the lungs every 4 (four) hours as needed for wheezing.      No current facility-administered medications for this visit.     Allergies:   Patient has no known allergies.    Social History:  The patient  reports that she has been smoking cigarettes. She has a 15.00 pack-year smoking history. She has never used smokeless tobacco. She reports that she drinks about 5.0 - 7.0 standard drinks of alcohol per week. She reports that she does not use drugs.   Family History:  The patient's family history includes Cancer in her paternal grandfather; Heart attack (age of onset: 46) in her father; Osteoarthritis in her mother.    ROS:  Please see the history of present illness.   Otherwise, review of systems are positive for none.   All other systems are reviewed and negative.    PHYSICAL EXAM: VS:  BP 114/70   Pulse 75   Ht 5\' 5"  (1.651 m)   Wt 138 lb (62.6  kg)   LMP 03/23/2018 (Approximate)   SpO2 98%   BMI 22.96 kg/m  , BMI Body mass index is 22.96 kg/m. GENERAL:  Well appearing HEENT:  Pupils equal round and reactive, fundi not visualized, oral mucosa unremarkable NECK:  No jugular venous distention, waveform within normal limits, carotid upstroke brisk and symmetric, no bruits, no thyromegaly LYMPHATICS:  No cervical, inguinal adenopathy LUNGS:  Clear to auscultation bilaterally BACK:  No CVA tenderness CHEST:  Unremarkable HEART:  PMI not displaced or sustained,S1 and S2 within normal limits, no S3, no S4, no clicks, no rubs, no murmurs ABD:  Flat, positive bowel sounds normal in frequency in pitch, no bruits, no rebound, no guarding, no midline pulsatile mass, no hepatomegaly, no splenomegaly EXT:  2 plus pulses throughout, no edema, no cyanosis no clubbing SKIN:  No rashes no nodules NEURO:  Cranial nerves II through XII grossly intact, motor grossly  intact throughout PSYCH:  Cognitively intact, oriented to person place and time    EKG:  EKG is not ordered today. The ekg ordered 03/28/18 demonstrates Sinus rhythm, rate 66, axis within normal limits, intervals within normal limits, no acute ST-T wave changes.   Recent Labs: 03/28/2018: BUN 8; Creatinine, Ser 0.58; Hemoglobin 13.8; Platelets 313; Potassium 4.9; Sodium 136    Lipid Panel No results found for: CHOL, TRIG, HDL, CHOLHDL, VLDL, LDLCALC, LDLDIRECT    Wt Readings from Last 3 Encounters:  04/14/18 138 lb (62.6 kg)  03/28/18 137 lb (62.1 kg)  09/13/14 134 lb 12.8 oz (61.1 kg)      Other studies Reviewed: Additional studies/ records that were reviewed today include: ED records. Review of the above records demonstrates:  Please see elsewhere in the note.     ASSESSMENT AND PLAN:  Chest pain: This is atypical.  However, she does have some risk factors with a family history.  I like to order a coronary calcium score.  No change in therapy at this point pending that score.  Tobacco: We talked about this.  She only smokes when she socially and she is to try to quit this.  Risk reduction: I did review her labs and her lipids were excellent.  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  No change in therapy.  No further suggestions other than above.  Tobacco abuse:  [facility] has discussed stopping smoking.  She does not want Chantix.  She understands the importance of stopping cigarettes.     Current medicines are reviewed at length with the patient today.  The patient does not have concerns regarding medicines.  The following changes have been made:  no change  Labs/ tests ordered today include:   Orders Placed This Encounter  Procedures  . CT CARDIAC SCORING     Disposition:   FU with as needed    Signed, Rollene Rotunda, MD  04/14/2018 1:58 PM    [facility]

## 2018-04-14 ENCOUNTER — Encounter: Payer: Self-pay | Admitting: Cardiology

## 2018-04-14 ENCOUNTER — Ambulatory Visit: Payer: BLUE CROSS/BLUE SHIELD | Admitting: Cardiology

## 2018-04-14 VITALS — BP 114/70 | HR 75 | Ht 65.0 in | Wt 138.0 lb

## 2018-04-14 DIAGNOSIS — Z720 Tobacco use: Secondary | ICD-10-CM

## 2018-04-14 DIAGNOSIS — R079 Chest pain, unspecified: Secondary | ICD-10-CM | POA: Diagnosis not present

## 2018-04-14 NOTE — Patient Instructions (Signed)
Medication Instructions:  Continue current medications  If you need a refill on your cardiac medications before your next appointment, please call your pharmacy.  Labwork: None Ordered   If you have labs (blood work) drawn today and your tests are completely normal, you will receive your results only by: Marland Kitchen MyChart Message (if you have MyChart) OR . A paper copy in the mail If you have any lab test that is abnormal or we need to change your treatment, we will call you to review the results.  Testing/Procedures: Your physician has requested that you have a Coronary Calcium Score test. This test is done at our [facility].  Follow-Up: . You will need a follow up appointment in As Needed.     At [facility], you and your health needs are our priority.  As part of our continuing mission to provide you with exceptional heart care, we have created designated Provider Care Teams.  These Care Teams include your primary Cardiologist (physician) and Advanced Practice Providers (APPs -  Physician Assistants and Nurse Practitioners) who all work together to provide you with the care you need, when you need it.   Thank you for choosing [facility] at [facility]!!

## 2018-05-01 ENCOUNTER — Inpatient Hospital Stay: Admission: RE | Admit: 2018-05-01 | Payer: BLUE CROSS/BLUE SHIELD | Source: Ambulatory Visit

## 2020-12-30 ENCOUNTER — Encounter (HOSPITAL_BASED_OUTPATIENT_CLINIC_OR_DEPARTMENT_OTHER): Payer: Self-pay | Admitting: Obstetrics and Gynecology

## 2020-12-30 ENCOUNTER — Other Ambulatory Visit: Payer: Self-pay

## 2020-12-30 ENCOUNTER — Emergency Department (HOSPITAL_BASED_OUTPATIENT_CLINIC_OR_DEPARTMENT_OTHER)
Admission: EM | Admit: 2020-12-30 | Discharge: 2020-12-30 | Discharge status: Against Medical Advice | Payer: 59 | Attending: Emergency Medicine | Admitting: Emergency Medicine

## 2020-12-30 ENCOUNTER — Emergency Department (HOSPITAL_BASED_OUTPATIENT_CLINIC_OR_DEPARTMENT_OTHER): Payer: 59 | Admitting: Radiology

## 2020-12-30 DIAGNOSIS — R778 Other specified abnormalities of plasma proteins: Secondary | ICD-10-CM

## 2020-12-30 DIAGNOSIS — I471 Supraventricular tachycardia, unspecified: Secondary | ICD-10-CM

## 2020-12-30 DIAGNOSIS — R7989 Other specified abnormal findings of blood chemistry: Secondary | ICD-10-CM

## 2020-12-30 DIAGNOSIS — R000 Tachycardia, unspecified: Secondary | ICD-10-CM | POA: Diagnosis present

## 2020-12-30 DIAGNOSIS — F17210 Nicotine dependence, cigarettes, uncomplicated: Secondary | ICD-10-CM | POA: Insufficient documentation

## 2020-12-30 DIAGNOSIS — R002 Palpitations: Secondary | ICD-10-CM | POA: Diagnosis not present

## 2020-12-30 LAB — CBC
HCT: 43.3 % (ref 36.0–46.0)
Hemoglobin: 14.4 g/dL (ref 12.0–15.0)
MCH: 31 pg (ref 26.0–34.0)
MCHC: 33.3 g/dL (ref 30.0–36.0)
MCV: 93.1 fL (ref 80.0–100.0)
Platelets: 386 10*3/uL (ref 150–400)
RBC: 4.65 MIL/uL (ref 3.87–5.11)
RDW: 13.8 % (ref 11.5–15.5)
WBC: 8.7 10*3/uL (ref 4.0–10.5)
nRBC: 0 % (ref 0.0–0.2)

## 2020-12-30 LAB — BASIC METABOLIC PANEL
Anion gap: 11 (ref 5–15)
BUN: 12 mg/dL (ref 6–20)
CO2: 26 mmol/L (ref 22–32)
Calcium: 9.5 mg/dL (ref 8.9–10.3)
Chloride: 102 mmol/L (ref 98–111)
Creatinine, Ser: 0.58 mg/dL (ref 0.44–1.00)
GFR, Estimated: >60 mL/min (ref >60)
Glucose, Bld: 99 mg/dL (ref 70–99)
Potassium: 3.4 mmol/L — ABNORMAL LOW (ref 3.5–5.1)
Sodium: 139 mmol/L (ref 135–145)

## 2020-12-30 LAB — MAGNESIUM: Magnesium: 2.1 mg/dL (ref 1.7–2.4)

## 2020-12-30 LAB — TROPONIN I (HIGH SENSITIVITY)
Troponin I (High Sensitivity): 8 ng/L (ref <18)
Troponin I (High Sensitivity): 39 ng/L — ABNORMAL HIGH (ref <18)

## 2020-12-30 MED ORDER — ADENOSINE 6 MG/2ML IV SOLN
INTRAVENOUS | Status: AC
  Filled 2020-12-30: qty 2

## 2020-12-30 MED ORDER — ADENOSINE 6 MG/2ML IV SOLN
INTRAVENOUS | Status: AC
  Filled 2020-12-30: qty 4

## 2020-12-30 NOTE — ED Notes (Signed)
MD Kirtland Bouchard. [facility] made aware of Patients Critical Result: Troponin: 39. Awaiting Consultation from Cardiology.

## 2020-12-30 NOTE — ED Provider Notes (Signed)
[facility] EMERGENCY DEPT Provider Note   CSN: [id number] Arrival date & time: 12/30/20  1549     History Chief Complaint  Patient presents with   Tachycardia    Heidi Nolan is a 50 y.o. female.  HPI  50 year old female with no admitted past medical history presents emergency department concern for palpitations/tachycardia.  Patient states earlier today while she was eating lunch her heart started racing.  She tried to lay down and rest however it did not subside.  She tried vagal maneuvers without any success so she came in for evaluation.  She denied any chest pain but did have associated shortness of breath with the tachycardia.  No known cardiac disease.  No swelling of her lower extremities, no history of DVT/PE.  Patient has had episodes of these palpitations before but they usually self resolve.  She has not seen a cardiologist recently for these complaints.  No recent fever or illness.  Past Medical History:  Diagnosis Date   Abnormal Pap smear of cervix    Cervical disc disease    HSV (herpes simplex virus) anogenital infection 2007    Patient Active Problem List   Diagnosis Date Noted   HSV (herpes simplex virus) anogenital infection 09/01/2014   Rectal fissure 09/01/2014   Cervical spinal stenosis 03/01/2013    Past Surgical History:  Procedure Laterality Date   CESAREAN SECTION  1991   NECK SURGERY  9/14   [facility]     OB History     Gravida  2   Para  1   Term  1   Preterm      AB      Living  1      SAB      IAB      Ectopic      Multiple      Live Births              Family History  Problem Relation Age of Onset   Osteoarthritis Mother    Heart attack Father 83       Died of MI   Cancer Paternal Grandfather     Social History   Tobacco Use   Smoking status: Heavy Smoker    Packs/day: 0.50    Years: 30.00    Pack years: 15.00    Types: Cigarettes   Smokeless tobacco: Never   Tobacco comments:     one pack/week  Substance Use Topics   Alcohol use: Yes    Alcohol/week: 5.0 - 7.0 standard drinks    Types: 5 - 7 Standard drinks or equivalent per week    Comment: 1 glass of wine at night   Drug use: No    Home Medications Prior to Admission medications   Medication Sig Start Date End Date Taking? Authorizing Provider  cetirizine (ZYRTEC) 10 MG tablet Take 10 mg by mouth daily.    [provider]  montelukast (SINGULAIR) 10 MG tablet Take 10 mg by mouth daily. 12/26/17   [provider]  VENTOLIN HFA 108 (90 BASE) MCG/ACT inhaler Inhale 2 puffs into the lungs every 4 (four) hours as needed for wheezing.  08/15/12   [provider]    Allergies    Patient has no known allergies.  Review of Systems   Review of Systems  Constitutional:  Negative for chills and fever.  HENT:  Negative for congestion.   Eyes:  Negative for visual disturbance.  Respiratory:  Positive for shortness of  breath. Negative for chest tightness.   Cardiovascular:  Positive for palpitations. Negative for chest pain and leg swelling.  Gastrointestinal:  Negative for abdominal pain, diarrhea and vomiting.  Genitourinary:  Negative for dysuria.  Skin:  Negative for rash.  Neurological:  Negative for headaches.   Physical Exam Updated Vital Signs BP (!) 135/101 (BP Location: Right Arm)   Pulse 86   Temp 97.9 F (36.6 C)   Resp 13   LMP 12/07/2020 (Approximate)   SpO2 100%   Physical Exam Vitals and nursing note reviewed.  Constitutional:      Appearance: Normal appearance. She is not ill-appearing or diaphoretic.  HENT:     Head: Normocephalic.     Mouth/Throat:     Mouth: Mucous membranes are moist.  Cardiovascular:     Rate and Rhythm: Tachycardia present.  Pulmonary:     Effort: Pulmonary effort is normal. No respiratory distress.  Abdominal:     Palpations: Abdomen is soft.     Tenderness: There is no abdominal tenderness.  Musculoskeletal:        General: No  swelling or deformity.  Skin:    General: Skin is warm.  Neurological:     Mental Status: She is alert and oriented to person, place, and time. Mental status is at baseline.  Psychiatric:        Mood and Affect: Mood normal.    ED Results / Procedures / Treatments   Labs (all labs ordered are listed, but only abnormal results are displayed) Labs Reviewed  BASIC METABOLIC PANEL - Abnormal; Notable for the following components:      Result Value   Potassium 3.4 (*)    All other components within normal limits  CBC  PREGNANCY, URINE  MAGNESIUM  TROPONIN I (HIGH SENSITIVITY)    EKG None  Radiology DG Chest 2 View  Result Date: 12/30/2020 CLINICAL DATA:  Tachycardia. EXAM: CHEST - 2 VIEW COMPARISON:  Chest x-ray 03/28/2018 FINDINGS: The heart size and mediastinal contours are within normal limits. No focal consolidation. No pulmonary edema. No pleural effusion. No pneumothorax. No acute osseous abnormality.  Cervical spine surgical hardware. IMPRESSION: No active cardiopulmonary disease. Electronically Signed   By: Tish Frederickson M.D.   On: 12/30/2020 16:36    Procedures Procedures   Medications Ordered in ED Medications  adenosine (ADENOCARD) 6 MG/2ML injection (has no administration in time range)  adenosine (ADENOCARD) 6 MG/2ML injection (has no administration in time range)    ED Course  I have reviewed the triage vital signs and the nursing notes.  Pertinent labs & imaging results that were available during my care of the patient were reviewed by me and considered in my medical decision making (see chart for details).    MDM Rules/Calculators/A&P                           50 year old female presents the emergency department with concern for palpitations and tachycardia.  Arrival EKG shows SVT. BP stable. Mild SOB without CP.   Patient spontaneously converted to NSR during IV placement. Blood work shows normal electrolytes with initial troponin normal. 90 minute  troponin elevated to 39. Patient remains in NSR without CP. Planned for cardiology consult and possible rate control medication for prevention but patient decided to leave AMA.  I have recommended that the patient stays in the department for the completion of their workup however they decline.  I have expressed the importance of staying  including the risks of leaving which include worsening condition, permanent disability and death.  Patient accepts these risks.  They are alert and oriented, they have capacity to make this decision.  I have not been able to convince the patient to stay and they understand the risks of leaving AGAINST MEDICAL ADVICE.    Final Clinical Impression(s) / ED Diagnoses Final diagnoses:  None    Rx / DC Orders ED Discharge Orders     None        Rozelle Logan, DO 12/30/20 2101

## 2020-12-30 NOTE — Discharge Instructions (Addendum)
You have chosen to leave the emergency department AGAINST MEDICAL ADVICE.  I have explained to you your testing results and the need for further evaluation/cardiology recommendations.  You have verbalized understanding of these results and plan and are choosing to leave the hospital AGAINST MEDICAL ADVICE. You understand the risks associated with leaving without further evaluation/treatment. If you have any worsening symptoms or choose to be treated please return immediately to emergency department.

## 2020-12-30 NOTE — ED Triage Notes (Signed)
Patient reports to the ER for Tachycardia. Patient reports she is on Adderall. Patient reports her HR was 176 at home. Patient reports mild ShOB.  Patient denies nausea or emesis

## 2020-12-30 NOTE — ED Notes (Signed)
Patient provided with [facility] Summary. Patient understanding of Summary and signed [facility] Discharge willingly. Patient ambulatory upon departure with Family.

## 2021-01-02 ENCOUNTER — Ambulatory Visit: Payer: 59 | Admitting: Cardiology

## 2021-01-02 ENCOUNTER — Other Ambulatory Visit: Payer: Self-pay

## 2021-01-02 ENCOUNTER — Encounter: Payer: Self-pay | Admitting: Cardiology

## 2021-01-02 VITALS — BP 131/85 | HR 84 | Temp 98.2°F | Resp 16 | Ht 65.0 in | Wt 137.9 lb

## 2021-01-02 DIAGNOSIS — I471 Supraventricular tachycardia: Secondary | ICD-10-CM

## 2021-01-02 DIAGNOSIS — F909 Attention-deficit hyperactivity disorder, unspecified type: Secondary | ICD-10-CM

## 2021-01-02 DIAGNOSIS — Z8249 Family history of ischemic heart disease and other diseases of the circulatory system: Secondary | ICD-10-CM

## 2021-01-02 DIAGNOSIS — F17200 Nicotine dependence, unspecified, uncomplicated: Secondary | ICD-10-CM

## 2021-01-02 NOTE — Progress Notes (Signed)
Date:  01/02/2021   ID:  Heidi Nolan, DOB Aug 08, 1970, MRN [id number]  PCP:  [facility] [place]  Cardiologist:  Tessa Lerner, DO, [facility] (established care 01/02/2021) Former Cardiology Providers: Rollene Rotunda   REASON FOR CONSULT: Supraventricular tachycardia  REQUESTING PHYSICIAN:  [facility] [place] [address] [place],  [place] [address]  Chief Complaint  Patient presents with   Supraventricular tachycardia   New Patient (Initial Visit)    HPI  Heidi Nolan is a 50 y.o. female who presents to the office with a chief complaint of " palpitations." Patient's past medical history and cardiovascular risk factors include: Hx of SVT, ADHD, smoking (0.5ppd), family history of premature CAD.  She is referred to the office at the request of [place] M* for evaluation of supraventricular tachycardia.  Patient presents for evaluation of palpitations and recent ER visit for SVT.  Last week Tuesday patient states that she was in her normal state of health and was doing meal preps and had a sudden onset of palpitations.  She had placed a pulse ox which noted a heart rate of 124 bpm.  She tried to rest down and do some maneuvers such as holding her breath, coughing, splashing cold water, carotid massage without any significant improvement in her heart rate.  Her symptoms lasted from 11:30 AM to 4 PM and when she noticed a heart rate of 176 her girlfriend took her to the ED for further evaluation and management.  When she checked in patient states that her heart rate was around 150 bpm.  When ancillary staff was placing an IV she converted to normal sinus rhythm as she has fears of needles.  The night prior was her girlfriend's birthday and she may have had more than anticipated amount of alcohol including wine and liquor.  And recently has been having heavier and frequent menstrual periods for the last 1 month.  She consumes 1 cup  of coffee on a daily basis, has been on Adderall for several years for ADHD.  No consumptions of soda, energy drinks, illicits, weight loss supplements, herbal supplements.  No known thyroid disease and she used to have a history of anemia.  Patient randomly has episodes of palpitations usually at night when she lays on her lateral side but these episodes are short-lived.  She was given verapamil by her PCP but has not filled it.  Her first episode of supraventricular tachycardia was approximately 3 years ago which was short-lived and did not need any medical attention.  Patient denies any chest pain or shortness of breath at rest or with effort related activities to suggest ACS or heart failure.  Father passed away at the age of 25 due to myocardial infarction.  FUNCTIONAL STATUS: Lives a very active lifestyle but no organized exercise program or daily routine.  ALLERGIES: No Known Allergies  MEDICATION LIST PRIOR TO VISIT: Current Meds  Medication Sig   amphetamine-dextroamphetamine (ADDERALL) 20 MG tablet Take 20 mg by mouth 3 (three) times daily.   cetirizine (ZYRTEC) 10 MG tablet Take 10 mg by mouth daily.   Cholecalciferol (VITAMIN D3) 1.25 MG (50000 UT) CAPS Take 1 capsule by mouth daily at 12 noon.   montelukast (SINGULAIR) 10 MG tablet Take 10 mg by mouth daily.   OVER THE COUNTER MEDICATION Take 1 tablet by mouth daily at 12 noon. Digestive enzymes with Prebiotic   OVER THE COUNTER MEDICATION Take 20 mg by mouth daily  at 12 noon. [employer] Acid Relief   VENTOLIN HFA 108 (90 BASE) MCG/ACT inhaler Inhale 2 puffs into the lungs every 4 (four) hours as needed for wheezing.      PAST MEDICAL HISTORY: Past Medical History:  Diagnosis Date   Abnormal Pap smear of cervix    Cervical disc disease    SVT (supraventricular tachycardia) (HCC)     PAST SURGICAL HISTORY: Past Surgical History:  Procedure Laterality Date   CESAREAN SECTION  1991   NECK SURGERY  9/14   [facility]     FAMILY HISTORY: The patient family history includes Cancer in her paternal grandfather; Heart attack (age of onset: 79) in her father; Osteoarthritis in her mother.  SOCIAL HISTORY:  The patient  reports that she has been smoking cigarettes. She has a 15.00 pack-year smoking history. She has never used smokeless tobacco. She reports current alcohol use of about 5.0 - 7.0 standard drinks of alcohol per week. She reports that she does not use drugs.  REVIEW OF SYSTEMS: Review of Systems  Constitutional: Negative for chills and fever.  HENT:  Negative for hoarse voice and nosebleeds.   Eyes:  Negative for discharge, double vision and pain.  Cardiovascular:  Positive for palpitations. Negative for chest pain, claudication, dyspnea on exertion, leg swelling, near-syncope, orthopnea, paroxysmal nocturnal dyspnea and syncope.  Respiratory:  Negative for hemoptysis and shortness of breath.   Musculoskeletal:  Negative for muscle cramps and myalgias.  Gastrointestinal:  Negative for abdominal pain, constipation, diarrhea, flatus, hematemesis, hematochezia, melena, nausea and vomiting.  Genitourinary:  Positive for menorrhagia.  Neurological:  Negative for dizziness and light-headedness.   PHYSICAL EXAM: Vitals with BMI 01/02/2021 12/30/2020 12/30/2020  Height $Remov'5\' 5"'bpYUGM$  - -  Weight 137 lbs 14 oz - -  BMI 95.09 - -  Systolic 326 712 458  Diastolic 85 88 84  Pulse 84 71 70    CONSTITUTIONAL: Well-developed and well-nourished. No acute distress.  SKIN: Skin is warm and dry. No rash noted. No cyanosis. No pallor. No jaundice HEAD: Normocephalic and atraumatic.  EYES: No scleral icterus MOUTH/THROAT: Moist oral membranes.  NECK: No JVD present. No thyromegaly noted. No carotid bruits  LYMPHATIC: No visible cervical adenopathy.  CHEST Normal respiratory effort. No intercostal retractions  LUNGS: Clear to auscultation bilaterally.  No stridor. No wheezes. No rales.  CARDIOVASCULAR: Regular rate  and rhythm, positive S1-S2, no murmurs rubs or gallops appreciated ABDOMINAL: No apparent ascites.  EXTREMITIES: No peripheral edema, 2+ dorsalis pedis and posterior tibial pulses HEMATOLOGIC: No significant bruising NEUROLOGIC: Oriented to person, place, and time. Nonfocal. Normal muscle tone.  PSYCHIATRIC: Normal mood and affect. Normal behavior. Cooperative  CARDIAC DATABASE: EKG: 01/02/2021: Normal sinus rhythm, 72 bpm, normal axis, RSR prime, without underlying ischemia or injury pattern.   Echocardiogram: No results found for this or any previous visit from the past 1095 days.   Stress Testing: No results found for this or any previous visit from the past 1095 days.  Heart Catheterization: None  LABORATORY DATA: CBC Latest Ref Rng & Units 12/30/2020 03/28/2018 08/30/2012  WBC 4.0 - 10.5 K/uL 8.7 4.6 -  Hemoglobin 12.0 - 15.0 g/dL 14.4 13.8 12.6  Hematocrit 36.0 - 46.0 % 43.3 43.4 -  Platelets 150 - 400 K/uL 386 313 -    CMP Latest Ref Rng & Units 12/30/2020 03/28/2018  Glucose 70 - 99 mg/dL 99 113(H)  BUN 6 - 20 mg/dL 12 8  Creatinine 0.44 - 1.00 mg/dL 0.58 0.58  Sodium  135 - 145 mmol/L 139 136  Potassium 3.5 - 5.1 mmol/L 3.4(L) 4.9  Chloride 98 - 111 mmol/L 102 105  CO2 22 - 32 mmol/L 26 25  Calcium 8.9 - 10.3 mg/dL 9.5 9.4    Lipid Panel  No results found for: CHOL, TRIG, HDL, CHOLHDL, VLDL, LDLCALC, LDLDIRECT, LABVLDL  No components found for: NTPROBNP No results for input(s): PROBNP in the last 8760 hours. No results for input(s): TSH in the last 8760 hours.  BMP Recent Labs    12/30/20 1606  NA 139  K 3.4*  CL 102  CO2 26  GLUCOSE 99  BUN 12  CREATININE 0.58  CALCIUM 9.5  GFRNONAA >60    HEMOGLOBIN A1C No results found for: HGBA1C, MPG  IMPRESSION:    ICD-10-CM   1. Supraventricular tachycardia (HCC)  I47.1 EKG 12-Lead    PCV ECHOCARDIOGRAM COMPLETE    LONG TERM MONITOR (3-14 DAYS)    2. Smoking  F17.200     3. Attention deficit  hyperactivity disorder (ADHD), unspecified ADHD type  F90.9     4. Family history of premature CAD  Z82.49 CT CARDIAC SCORING ([facility] LOCATIONS ONLY)    Lipid Panel With LDL/HDL Ratio    LDL cholesterol, direct    CMP14+EGFR    B-HCG Quant       RECOMMENDATIONS: Heidi Nolan is a 50 y.o. female whose past medical history and cardiac risk factors include: Hx of SVT, ADHD, smoking (0.5ppd), family history of premature CAD.  Supraventricular tachycardia: ER records reviewed.  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 Most likely secondary to underlying hypokalemia, excessive drinking the night prior due to attending a friend's birthday party, and heavy menstrual bleeding as discussed above. Discussed avoiding modifiable triggers for now.   Check an echocardiogram to evaluate for structural heart disease.  If normal LVEF will consider as needed use of diltiazem for breakthrough episodes.  If she has frequent episodes may be a candidate for AVNRT ablation will need EP consultation at that point.    Family history of premature CAD: Check a coronary calcium score for further risk stratification. Prior history of hyperlipidemia, we will check a fasting lipid profile to reevaluate her lipids Would recommend a stress test after knowing her coronary calcium score. Educated on the importance of improving her modifiable cardiovascular risk factors such as complete smoking cessation.  Smoking: Tobacco cessation counseling: Currently smoking 0.5 packs/day   Patient is willing to quit at this time. Approximately 5 mins were spent counseling patient cessation techniques. We discussed various methods to help quit smoking, including deciding on a date to quit, joining a support group, pharmacological agents- nicotine gum/patch/lozenges, chantix.  I will reassess her progress at the next follow-up visit  FINAL MEDICATION LIST END OF ENCOUNTER: No orders of the defined  types were placed in this encounter.   Medications Discontinued During This Encounter  Medication Reason   verapamil (CALAN) 120 MG tablet Change in therapy     Current Outpatient Medications:    amphetamine-dextroamphetamine (ADDERALL) 20 MG tablet, Take 20 mg by mouth 3 (three) times daily., Disp: , Rfl:    cetirizine (ZYRTEC) 10 MG tablet, Take 10 mg by mouth daily., Disp: , Rfl:    Cholecalciferol (VITAMIN D3) 1.25 MG (50000 UT) CAPS, Take 1 capsule by mouth daily at 12 noon., Disp: , Rfl:    montelukast (SINGULAIR) 10 MG tablet, Take 10 mg by mouth daily., Disp: , Rfl: 3  OVER THE COUNTER MEDICATION, Take 1 tablet by mouth daily at 12 noon. Digestive enzymes with Prebiotic, Disp: , Rfl:    OVER THE COUNTER MEDICATION, Take 20 mg by mouth daily at 12 noon. [employer] Acid Relief, Disp: , Rfl:    VENTOLIN HFA 108 (90 BASE) MCG/ACT inhaler, Inhale 2 puffs into the lungs every 4 (four) hours as needed for wheezing. , Disp: , Rfl:   Orders Placed This Encounter  Procedures   CT CARDIAC SCORING ([facility] LOCATIONS ONLY)   Lipid Panel With LDL/HDL Ratio   LDL cholesterol, direct   CMP14+EGFR   B-HCG Quant   LONG TERM MONITOR (3-14 DAYS)   EKG 12-Lead   PCV ECHOCARDIOGRAM COMPLETE    There are no Patient Instructions on file for this visit.   --Continue cardiac medications as reconciled in final medication list. --Return in about 6 weeks (around 02/13/2021) for Follow up, Palpitations, Review test results. Or sooner if needed. --Continue follow-up with your primary care physician regarding the management of your other chronic comorbid conditions.  Patient's questions and concerns were addressed to her satisfaction. She voices understanding of the instructions provided during this encounter.   This note was created using a voice recognition software as a result there may be grammatical errors inadvertently enclosed that do not reflect the nature of this encounter. Every attempt is made to  correct such errors.  Rex Kras, [place], [facility]  Pager: [phone] Office: [phone]

## 2021-01-12 ENCOUNTER — Other Ambulatory Visit: Payer: Self-pay

## 2021-01-12 ENCOUNTER — Ambulatory Visit: Payer: 59

## 2021-01-12 DIAGNOSIS — I471 Supraventricular tachycardia: Secondary | ICD-10-CM

## 2021-01-15 ENCOUNTER — Other Ambulatory Visit: Payer: Self-pay | Admitting: Gynecology

## 2021-01-15 DIAGNOSIS — R928 Other abnormal and inconclusive findings on diagnostic imaging of breast: Secondary | ICD-10-CM

## 2021-01-15 NOTE — Progress Notes (Signed)
Called pt no answer, could not left a vm

## 2021-01-16 NOTE — Progress Notes (Signed)
Attempted to call pt, no answer. Unable to leave vm requesting call back

## 2021-01-20 ENCOUNTER — Telehealth: Payer: Self-pay

## 2021-01-20 NOTE — Progress Notes (Signed)
Third attempt to contact pt, no answer. Left vm requesting call back. No attempt has been made to contact us at this time.

## 2021-01-23 ENCOUNTER — Other Ambulatory Visit: Payer: 59

## 2021-01-24 LAB — CMP14+EGFR
ALT: 26 IU/L (ref 0–32)
AST: 20 IU/L (ref 0–40)
Albumin/Globulin Ratio: 2.1 (ref 1.2–2.2)
Albumin: 4.7 g/dL (ref 3.8–4.8)
Alkaline Phosphatase: 54 IU/L (ref 44–121)
BUN/Creatinine Ratio: 17 (ref 9–23)
BUN: 10 mg/dL (ref 6–24)
Bilirubin Total: 0.4 mg/dL (ref 0.0–1.2)
CO2: 25 mmol/L (ref 20–29)
Calcium: 9.2 mg/dL (ref 8.7–10.2)
Chloride: 104 mmol/L (ref 96–106)
Creatinine, Ser: 0.58 mg/dL (ref 0.57–1.00)
Globulin, Total: 2.2 g/dL (ref 1.5–4.5)
Glucose: 81 mg/dL (ref 65–99)
Potassium: 5.1 mmol/L (ref 3.5–5.2)
Sodium: 142 mmol/L (ref 134–144)
Total Protein: 6.9 g/dL (ref 6.0–8.5)
eGFR: 112 mL/min/{1.73_m2} (ref >59)

## 2021-01-24 LAB — LIPID PANEL WITH LDL/HDL RATIO
Cholesterol, Total: 194 mg/dL (ref 100–199)
HDL: 97 mg/dL (ref >39)
LDL Chol Calc (NIH): 81 mg/dL (ref 0–99)
LDL/HDL Ratio: 0.8 ratio (ref 0.0–3.2)
Triglycerides: 89 mg/dL (ref 0–149)
VLDL Cholesterol Cal: 16 mg/dL (ref 5–40)

## 2021-01-24 LAB — LDL CHOLESTEROL, DIRECT: LDL Direct: 86 mg/dL (ref 0–99)

## 2021-01-30 ENCOUNTER — Ambulatory Visit: Payer: 59

## 2021-01-30 ENCOUNTER — Ambulatory Visit
Admission: RE | Admit: 2021-01-30 | Discharge: 2021-01-30 | Disposition: A | Discharge status: Home / Self Care | Payer: 59 | Source: Ambulatory Visit | Attending: Gynecology | Admitting: Gynecology

## 2021-01-30 ENCOUNTER — Other Ambulatory Visit: Payer: Self-pay

## 2021-01-30 DIAGNOSIS — R928 Other abnormal and inconclusive findings on diagnostic imaging of breast: Secondary | ICD-10-CM

## 2021-02-09 ENCOUNTER — Ambulatory Visit
Admission: RE | Admit: 2021-02-09 | Discharge: 2021-02-09 | Disposition: A | Discharge status: Home / Self Care | Payer: 59 | Source: Ambulatory Visit | Attending: Cardiology | Admitting: Cardiology

## 2021-02-09 DIAGNOSIS — Z8249 Family history of ischemic heart disease and other diseases of the circulatory system: Secondary | ICD-10-CM

## 2021-02-09 NOTE — Telephone Encounter (Signed)
Error

## 2021-10-01 ENCOUNTER — Emergency Department (HOSPITAL_BASED_OUTPATIENT_CLINIC_OR_DEPARTMENT_OTHER): Payer: 59 | Admitting: Radiology

## 2021-10-01 ENCOUNTER — Other Ambulatory Visit: Payer: Self-pay

## 2021-10-01 ENCOUNTER — Encounter (HOSPITAL_BASED_OUTPATIENT_CLINIC_OR_DEPARTMENT_OTHER): Payer: Self-pay | Admitting: Emergency Medicine

## 2021-10-01 ENCOUNTER — Emergency Department (HOSPITAL_BASED_OUTPATIENT_CLINIC_OR_DEPARTMENT_OTHER): Payer: 59

## 2021-10-01 ENCOUNTER — Emergency Department (HOSPITAL_BASED_OUTPATIENT_CLINIC_OR_DEPARTMENT_OTHER)
Admission: EM | Admit: 2021-10-01 | Discharge: 2021-10-01 | Disposition: A | Discharge status: Home / Self Care | Payer: 59 | Attending: Emergency Medicine | Admitting: Emergency Medicine

## 2021-10-01 DIAGNOSIS — I471 Supraventricular tachycardia: Secondary | ICD-10-CM | POA: Diagnosis not present

## 2021-10-01 DIAGNOSIS — R072 Precordial pain: Secondary | ICD-10-CM | POA: Diagnosis present

## 2021-10-01 LAB — BASIC METABOLIC PANEL
Anion gap: 13 (ref 5–15)
BUN: 12 mg/dL (ref 6–20)
CO2: 24 mmol/L (ref 22–32)
Calcium: 10.1 mg/dL (ref 8.9–10.3)
Chloride: 102 mmol/L (ref 98–111)
Creatinine, Ser: 0.63 mg/dL (ref 0.44–1.00)
GFR, Estimated: >60 mL/min (ref >60)
Glucose, Bld: 104 mg/dL — ABNORMAL HIGH (ref 70–99)
Potassium: 4.2 mmol/L (ref 3.5–5.1)
Sodium: 139 mmol/L (ref 135–145)

## 2021-10-01 LAB — CBC
HCT: 43.2 % (ref 36.0–46.0)
Hemoglobin: 14.4 g/dL (ref 12.0–15.0)
MCH: 30.8 pg (ref 26.0–34.0)
MCHC: 33.3 g/dL (ref 30.0–36.0)
MCV: 92.5 fL (ref 80.0–100.0)
Platelets: 384 10*3/uL (ref 150–400)
RBC: 4.67 MIL/uL (ref 3.87–5.11)
RDW: 13.3 % (ref 11.5–15.5)
WBC: 6.5 10*3/uL (ref 4.0–10.5)
nRBC: 0 % (ref 0.0–0.2)

## 2021-10-01 LAB — PREGNANCY, URINE: Preg Test, Ur: NEGATIVE

## 2021-10-01 LAB — TROPONIN I (HIGH SENSITIVITY): Troponin I (High Sensitivity): <2 ng/L (ref <18)

## 2021-10-01 MED ORDER — METOPROLOL TARTRATE 5 MG/5ML IV SOLN
5.0000 mg | Freq: Once | INTRAVENOUS | Status: AC
  Administered 2021-10-01: 5 mg via INTRAVENOUS
  Filled 2021-10-01: qty 5

## 2021-10-01 MED ORDER — METOPROLOL TARTRATE 25 MG PO TABS: 25.0000 mg | ORAL_TABLET | Freq: Once | ORAL | 0 refills | Status: DC | PRN

## 2021-10-01 MED ORDER — LACTATED RINGERS IV SOLN: INTRAVENOUS | Status: DC

## 2021-10-01 NOTE — Discharge Instructions (Signed)
You were given a prescription for a medication called metoprolol.  If you start having palpitations again you can try taking that and waiting about an hour to see if it resolves.  If not you will need to return to the emergency room.  All your blood work and imaging looks good today. ?

## 2021-10-01 NOTE — ED Notes (Signed)
Patient ambulated to restroom. RN requested patient to use bedside commode. Patient reports feeling better and would rather walk to restroom.  ?

## 2021-10-01 NOTE — ED Provider Notes (Signed)
?[facility] EM[facility]PT ?Provider Note ? ? ?CSN: [id number] ?Arrival date & time: 10/01/21  1856 ? ?  ? ?History ? ?Chief Complaint  ?Patient presents with  ? Chest Pain  ? Tachycardia  ? ? ?Heidi Nolan is a 51 y.o. female. ? ?The history is provided by the patient.  ?Chest Pain ?Pain location:  Substernal area ?Pain quality: tightness   ?Pain radiates to:  Does not radiate ?Pain severity:  Moderate ?Onset quality:  Sudden ?Duration:  1 hour ?Timing:  Constant ?Progression:  Unchanged ?Chronicity:  Recurrent ?Context comment:  Had just rolled over in bed and suddenly felt her heart racing ?Relieved by:  Nothing ?Worsened by:  Exertion ?Ineffective treatments: valsalva maneuvers. ?Associated symptoms: palpitations and shortness of breath   ?Associated symptoms: no abdominal pain, no cough, no fever, no nausea and no vomiting   ?Risk factors comment:  Prior hx of svt, adhd ? ?  ? ?Home Medications ?Prior to Admission medications   ?Medication Sig Start Date End Date Taking? Authorizing Provider  ?metoprolol tartrate (LOPRESSOR) 25 MG tablet Take 1 tablet (25 mg total) by mouth once as needed for up to 1 dose. Take this tab as needed if you feel palpitations and your heart rate is elevated 10/01/21  Yes Nevah Dalal, Alphonzo Lemmings, MD  ?amphetamine-dextroamphetamine (ADDERALL) 20 MG tablet Take 20 mg by mouth 3 (three) times daily. 12/31/20   [provider]  ?cetirizine (ZYRTEC) 10 MG tablet Take 10 mg by mouth daily.    [provider]  ?Cholecalciferol (VITAMIN D3) 1.25 MG (50000 UT) CAPS Take 1 capsule by mouth daily at 12 noon.    [provider]  ?montelukast (SINGULAIR) 10 MG tablet Take 10 mg by mouth daily. 12/26/17   [provider]  ?OVER THE COUNTER MEDICATION Take 1 tablet by mouth daily at 12 noon. Digestive enzymes with Prebiotic    [provider]  ?OVER THE COUNTER MEDICATION Take 20 mg by mouth daily at 12 noon. [employer] Acid Relief    [provider]  ?VENTOLIN HFA 108 (90 BASE) MCG/ACT inhaler Inhale 2 puffs into the lungs every 4 (four) hours as needed for wheezing.  08/15/12   [provider]  ?   ? ?Allergies    ?Patient has no known allergies.   ? ?Review of Systems   ?Review of Systems  ?Constitutional:  Negative for fever.  ?Respiratory:  Positive for shortness of breath. Negative for cough.   ?Cardiovascular:  Positive for chest pain and palpitations.  ?Gastrointestinal:  Negative for abdominal pain, nausea and vomiting.  ? ?Physical Exam ?Updated Vital Signs ?BP (!) 113/93   Pulse 70   Temp 97.6 ?F (36.4 ?C) (Oral)   Resp 15   Ht 5\' 5"  (1.651 m)   Wt 59 kg   SpO2 100%   BMI 21.63 kg/m?  ?Physical Exam ?Vitals and nursing note reviewed.  ?Constitutional:   ?   General: She is not in acute distress. ?   Appearance: She is well-developed.  ?   Comments: Appears somewhat uncomfortable  ?HENT:  ?   Head: Normocephalic and atraumatic.  ?Eyes:  ?   Conjunctiva/sclera: Conjunctivae normal.  ?   Pupils: Pupils are equal, round, and reactive to light.  ?Cardiovascular:  ?   Rate and Rhythm: Regular rhythm. Tachycardia present.  ?   Heart sounds: No murmur heard. ?Pulmonary:  ?   Effort: Pulmonary effort is normal. Tachypnea present. No respiratory distress.  ?  Breath sounds: Normal breath sounds. No wheezing or rales.  ?Abdominal:  ?   General: There is no distension.  ?   Palpations: Abdomen is soft.  ?   Tenderness: There is no abdominal tenderness. There is no guarding or rebound.  ?Musculoskeletal:     ?   General: No tenderness. Normal range of motion.  ?   Cervical back: Normal range of motion and neck supple.  ?Skin: ?   General: Skin is warm and dry.  ?   Findings: No erythema or rash.  ?Neurological:  ?   Mental Status: She is alert and oriented to person, place, and time. Mental status is at baseline.  ?Psychiatric:     ?   Mood and Affect: Mood normal.     ?   Behavior: Behavior normal.  ? ? ?ED Results / Procedures /  Treatments   ?Labs ?(all labs ordered are listed, but only abnormal results are displayed) ?Labs Reviewed  ?BASIC METABOLIC PANEL - Abnormal; Notable for the following components:  ?    Result Value  ? Glucose, Bld 104 (*)   ? All other components within normal limits  ?CBC  ?PREGNANCY, URINE  ?TROPONIN I (HIGH SENSITIVITY)  ? ? ?EKG ?EKG Interpretation ? ?Date/Time:  Thursday October 01 2021 08:41:59 EDT ?Ventricular Rate:  76 ?PR Interval:  167 ?QRS Duration: 106 ?QT Interval:  405 ?QTC Calculation: 456 ?R Axis:   85 ?Text Interpretation: Sinus rhythm Supraventricular tachycardia RESOLVED SINCE PREVIOUS Confirmed by Gwyneth SproutPlunkett, Shynice Sigel ([id number]) on 10/01/2021 8:46:24 AM ? ?  ? EKG Interpretation ? ?Date/Time:  Thursday October 01 2021 08:41:59 EDT ?Ventricular Rate:  76 ?PR Interval:  167 ?QRS Duration: 106 ?QT Interval:  405 ?QTC Calculation: 456 ?R Axis:   85 ?Text Interpretation: Sinus rhythm Supraventricular tachycardia RESOLVED SINCE PREVIOUS Confirmed by Gwyneth SproutPlunkett, Alese Furniss ([id number]) on 10/01/2021 8:46:24 AM ?  ? ?  ?  ?Radiology ?CT Chest Wo Contrast ? ?Result Date: 10/01/2021 ?CLINICAL DATA:  Possible pneumothorax. Patient complains of left-sided chest tightness and tachycardia. Chest radiograph done earlier today showing possible pneumothorax. EXAM: CT CHEST WITHOUT CONTRAST TECHNIQUE: Multidetector CT imaging of the chest was performed following the standard protocol without IV contrast. RADIATION DOSE REDUCTION: This exam was performed according to the departmental dose-optimization program which includes automated exposure control, adjustment of the mA and/or kV according to patient size and/or use of iterative reconstruction technique. COMPARISON:  Earlier same day chest radiograph at 0745 hours FINDINGS: Cardiovascular: No significant vascular findings. Normal heart size. No pericardial effusion. Mediastinum/Nodes: No enlarged mediastinal or axillary lymph nodes. Thyroid gland, trachea, and esophagus demonstrate no  significant findings. Lungs/Pleura: Lungs are clear. No pleural effusion or pneumothorax. A 0.4 cm solid nodule is noted in the left upper lobe (series 4, image 80). Upper Abdomen: No acute abnormality. Musculoskeletal: No chest wall mass or suspicious bone lesions identified. Partially visualized anterior cervical spinal fusion. IMPRESSION: 1. No acute intrathoracic abnormality. Specifically, no pneumothorax as queried. 2. Incidentally noted 0.4 cm solid pulmonary nodule in the left upper lobe. No follow-up needed if patient is low-risk.This recommendation follows the consensus statement: Guidelines for Management of Incidental Pulmonary Nodules Detected on CT Images: From the Fleischner Society 2017; Radiology 2017; 284:228-243. Electronically Signed   By: Sherron AlesLaura  Parra M.D.   On: 10/01/2021 09:42  ? ?DG Chest Port 1 View ? ?Result Date: 10/01/2021 ?CLINICAL DATA:  Chest pain, tachycardia EXAM: PORTABLE CHEST 1 VIEW COMPARISON:  Previous studies including the examination of 12/30/2020 FINDINGS:  Cardiac size is within normal limits. There are no focal infiltrates or signs of pulmonary edema. There is no significant pleural effusion. There is faint air soft tissue interface in the left apex. This may be an artifact or suggest small left apical pneumothorax. There is previous surgical fusion in the cervical spine. IMPRESSION: There is possible small left apical pneumothorax. Repeat chest radiograph in expiration may be considered. There are no focal pulmonary infiltrates. Electronically Signed   By: Ernie Avena M.D.   On: 10/01/2021 08:08   ? ?Procedures ?Procedures  ? ? ?Medications Ordered in ED ?Medications  ?lactated ringers infusion (0 mLs Intravenous Stopped 10/01/21 0936)  ?metoprolol tartrate (LOPRESSOR) injection 5 mg (5 mg Intravenous Given 10/01/21 0743)  ?metoprolol tartrate (LOPRESSOR) injection 5 mg (5 mg Intravenous Given 10/01/21 0812)  ? ? ?ED Course/ Medical Decision Making/ A&P ?  ?                         ?Medical Decision Making ?Amount and/or Complexity of Data Reviewed ?External Data Reviewed: notes. ?Labs: ordered. Decision-making details documented in ED Course. ?Radiology: ordered and independent

## 2021-10-01 NOTE — ED Triage Notes (Signed)
Pt arrives to ED with c/o chest pain and tachycardia. She reports she woke up this morning, rolled over to her left and developed tightness in her chest. She reports she took he pulse which ranged from 150's to 170's. Hx SVT.  ?

## 2022-03-03 IMAGING — CT CT CARDIAC CORONARY ARTERY CALCIUM SCORE
3 series · 12 of 20 positions shown, 14 images · non-contrast
Comparison: No priors.

CLINICAL DATA: 49-year-old Caucasian female current smoker with
family history of coronary artery disease.

EXAM:
CT CARDIAC CORONARY ARTERY CALCIUM SCORE
TECHNIQUE: Non-contrast imaging through the heart was performed using
prospective ECG gating. Image post processing was performed on an
independent workstation, allowing for quantitative analysis of the
heart and coronary arteries. Note that this exam targets the heart
and the chest was not imaged in its entirety.

[Series 2: calcium scoring 2.00 qr36 bestdiast 69% hrt calciu · axial · 0.29mm/px · z∈[+1661,+1691]mm · 2 of 75 slices shown]
[im 15/75  vessel]
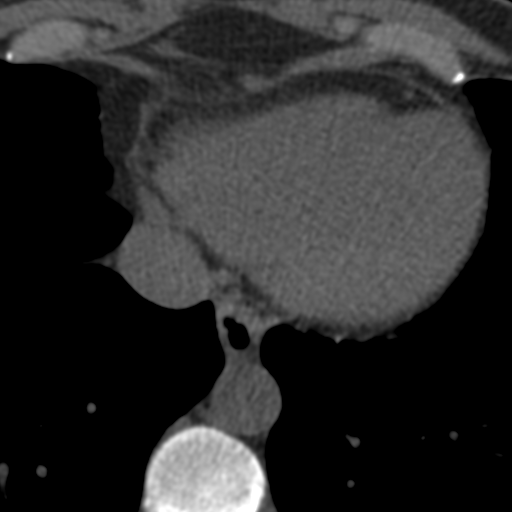
[im 30/75  vessel]
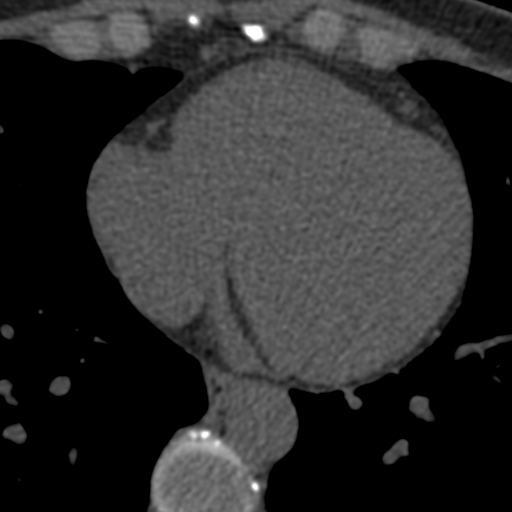

[Series 3: calcium scoring 2.00 br40 bestdiast 69% axial · axial · 0.41mm/px · z∈[+1657,+1755]mm · 5 of 75 slices shown, 7 images]
[im 13/75  vessel]
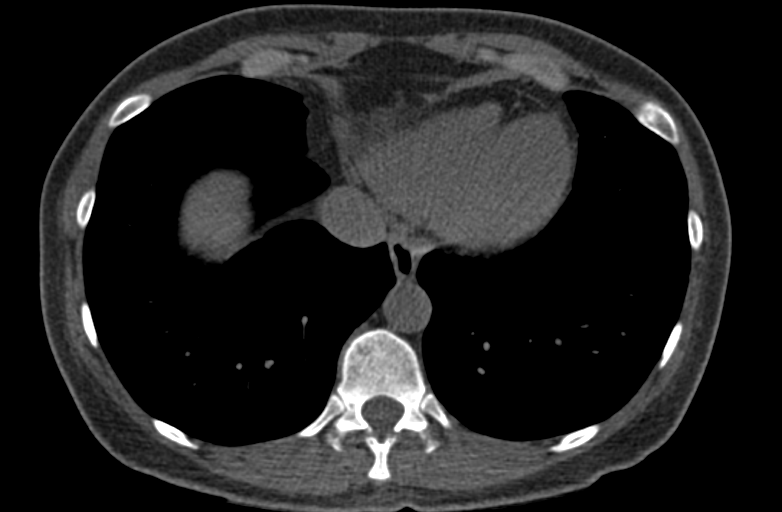
[im 13/75  lung]
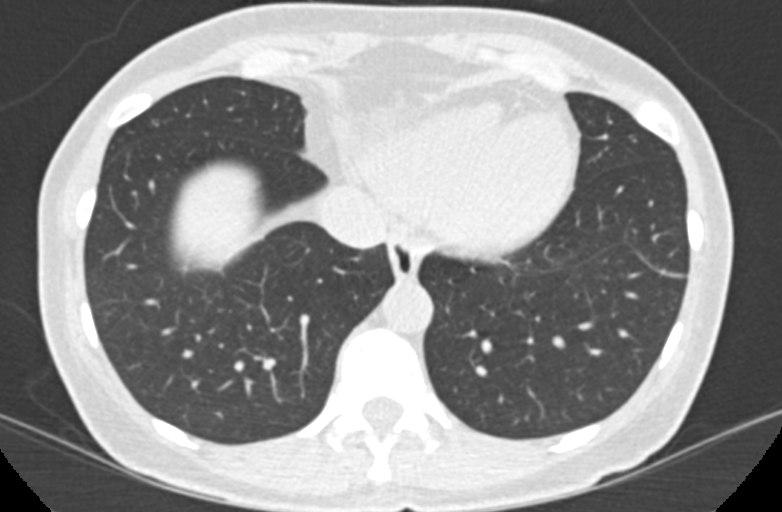
[im 25/75  vessel]
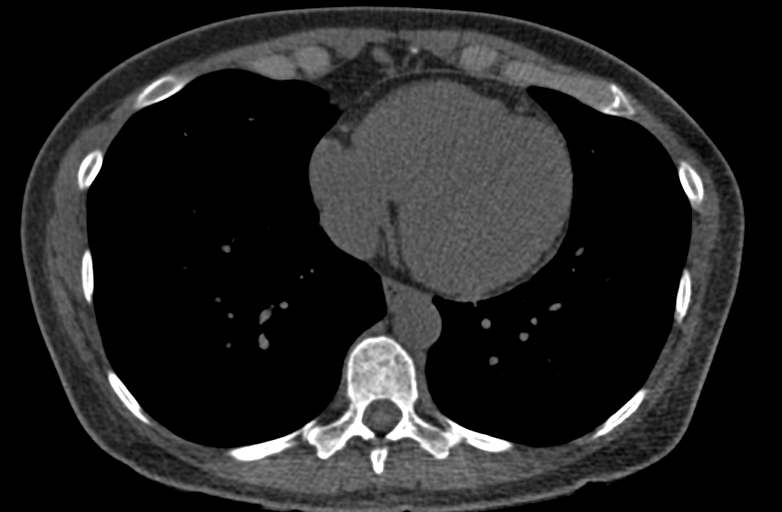
[im 38/75  vessel]
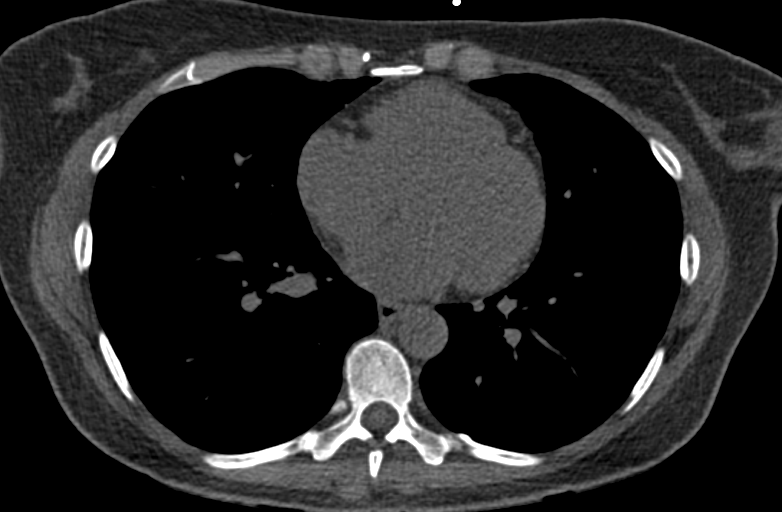
[im 50/75  vessel]
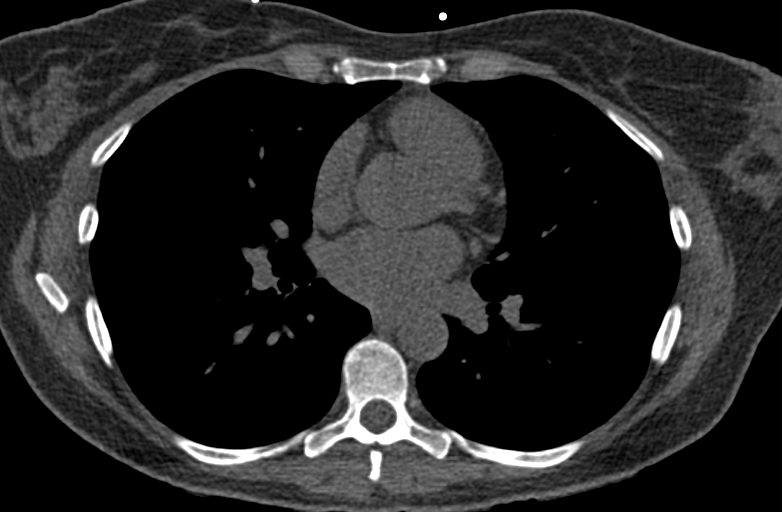
[im 62/75  vessel]
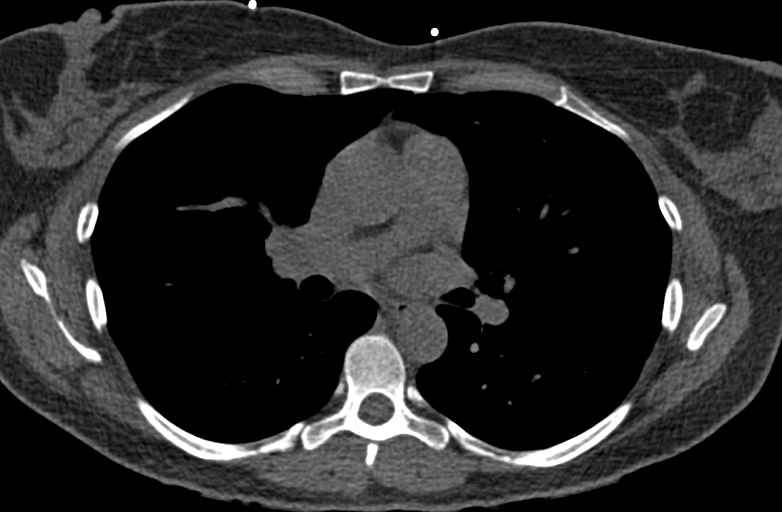
[im 62/75  lung]
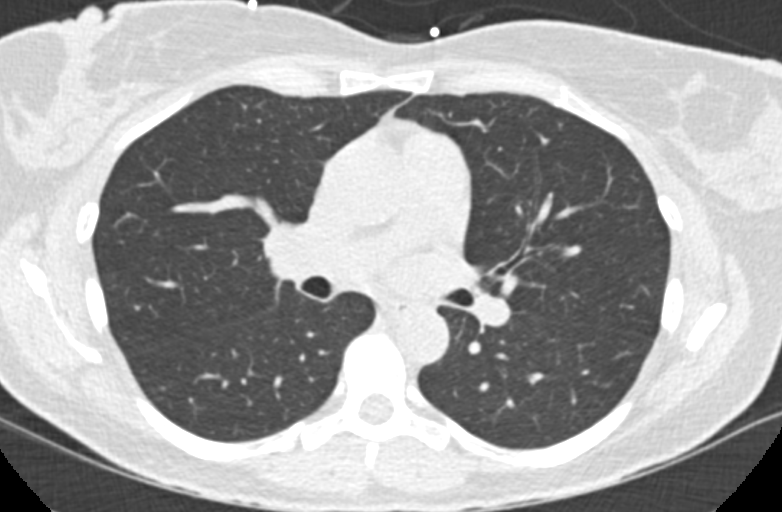

[Series 9: calcium scoring 2.00 br60 bestdiast 69% lungs · axial · 0.41mm/px · z∈[+1657,+1755]mm · 5 of 75 slices shown]
[im 13/75  vessel]
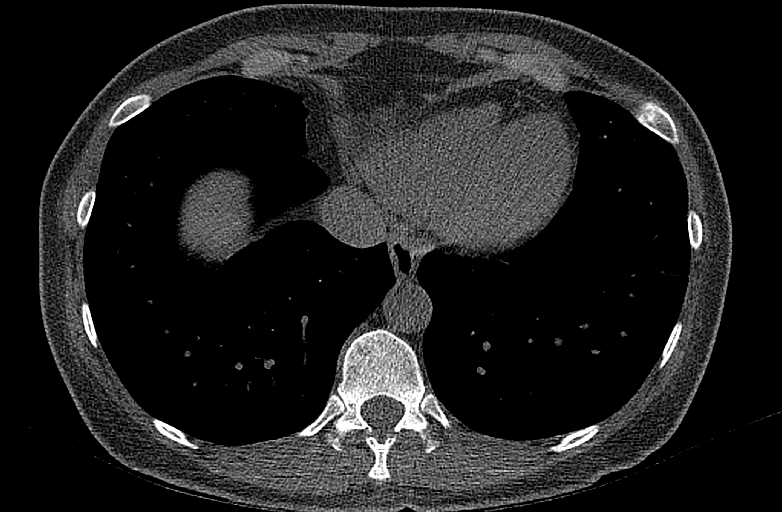
[im 25/75  vessel]
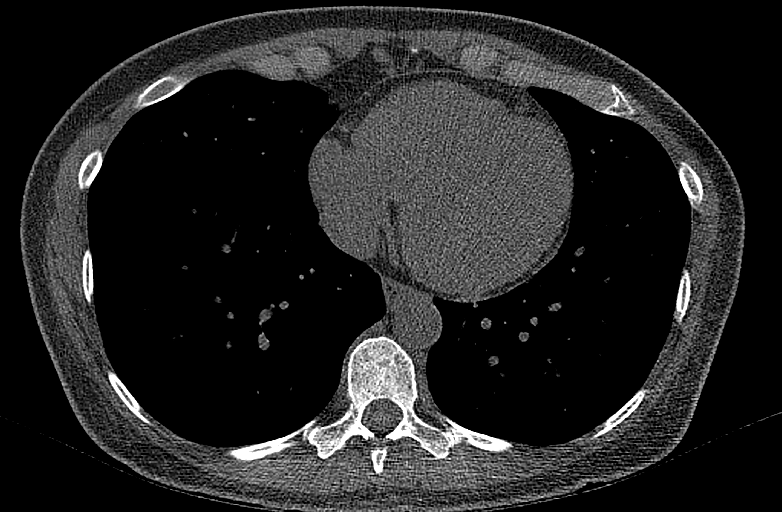
[im 38/75  vessel]
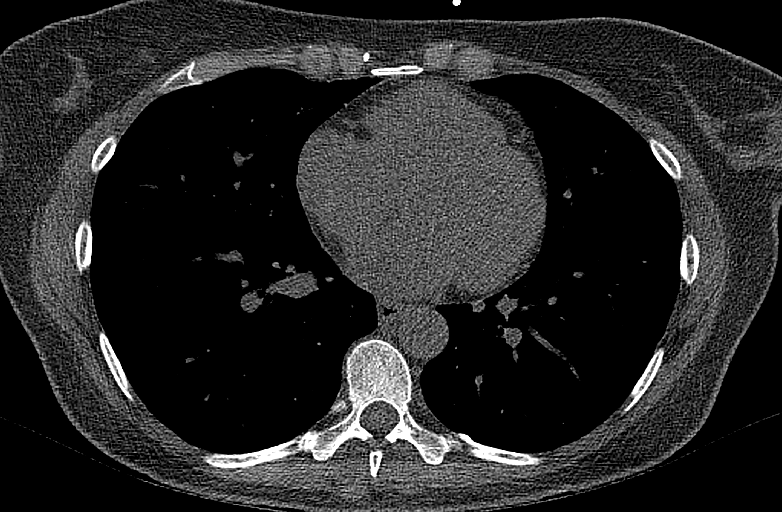
[im 50/75  vessel]
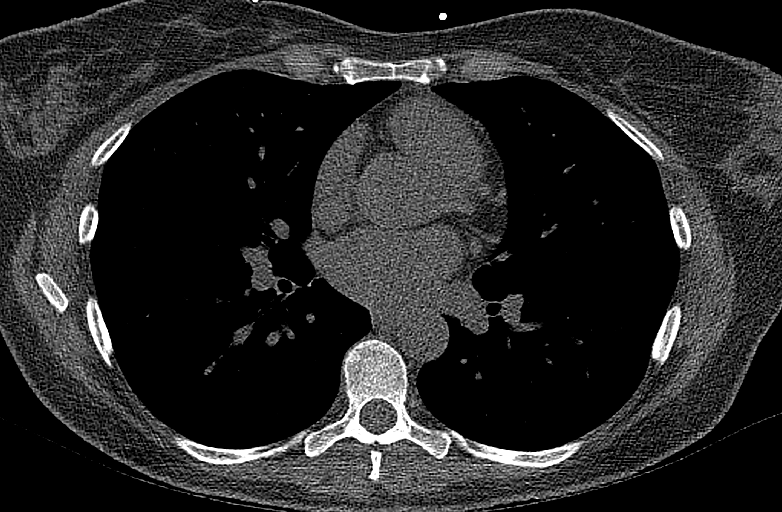
[im 62/75  vessel]
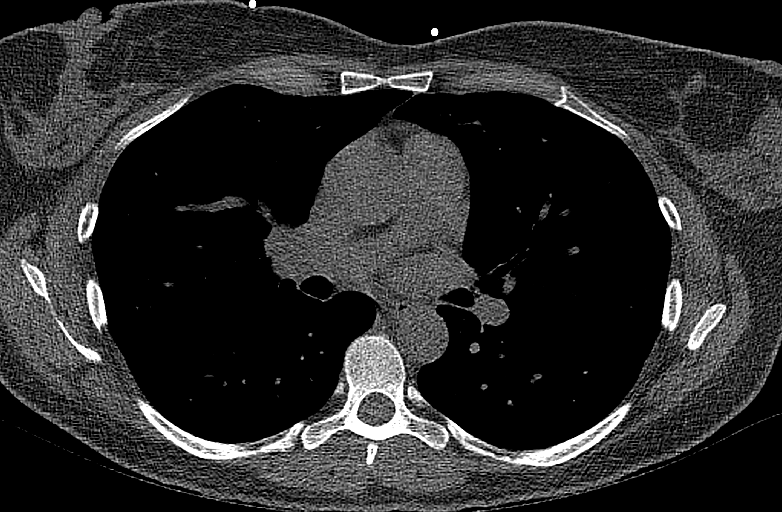

[12 of 20 positions shown; findings below may reference images not displayed]

FINDINGS: CORONARY CALCIUM SCORES:

Left Main: 0

LAD: 0

LCx: 0

RCA: 0

Total Agatston Score: 0

[HOSPITAL] percentile: N/A

AORTA MEASUREMENTS:

Ascending Aorta: 33 mm

Descending Aorta: 21 mm

OTHER FINDINGS:

Within the visualized portions of the thorax there are no suspicious
appearing pulmonary nodules or masses, there is no acute
consolidative airspace disease, no pleural effusions, no
pneumothorax and no lymphadenopathy. Visualized portions of the
upper abdomen are unremarkable. There are no aggressive appearing
lytic or blastic lesions noted in the visualized portions of the
skeleton.
IMPRESSION: 1. Patient's total coronary artery calcium score is 0 which
indicates a very low (but nonzero) risk of major adverse
cardiovascular events over the next 10 years.
2. No significant incidental noncardiac findings are noted.

## 2022-10-23 IMAGING — CT CT CHEST W/O CM
2 of 4 series · 15 of 36 positions shown, 18 images · non-contrast
Comparison: Earlier same day chest radiograph at 8928 hours

CLINICAL DATA: Possible pneumothorax. Patient complains of
left-sided chest tightness and tachycardia. Chest radiograph done
earlier today showing possible pneumothorax.



[Series 2: routine chest without · axial · non-contrast · 0.64mm/px · z∈[+1137,+1423]mm · 12 of 170 slices shown, 15 images]
[im 14/170  mediastinal]
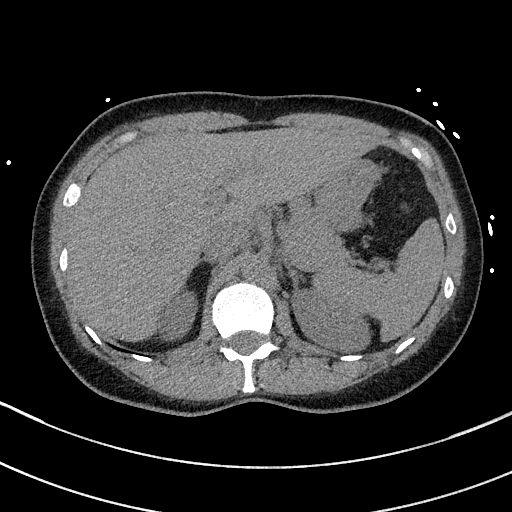
[im 14/170  lung]
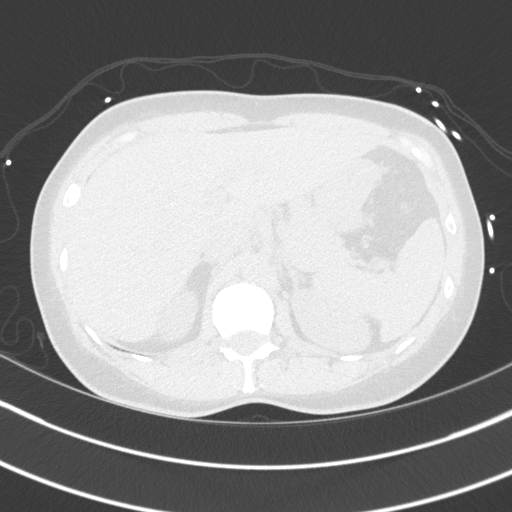
[im 27/170  lung]
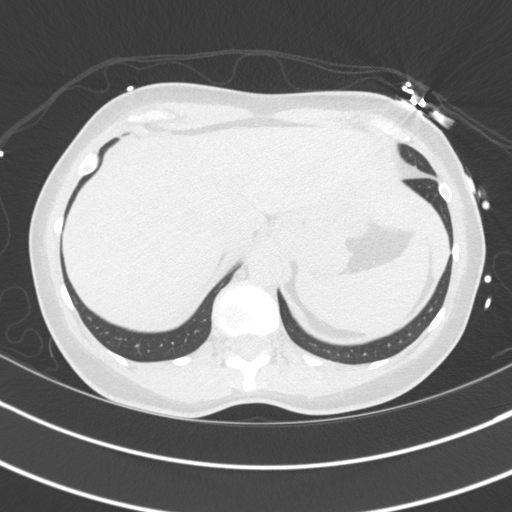
[im 40/170  lung]
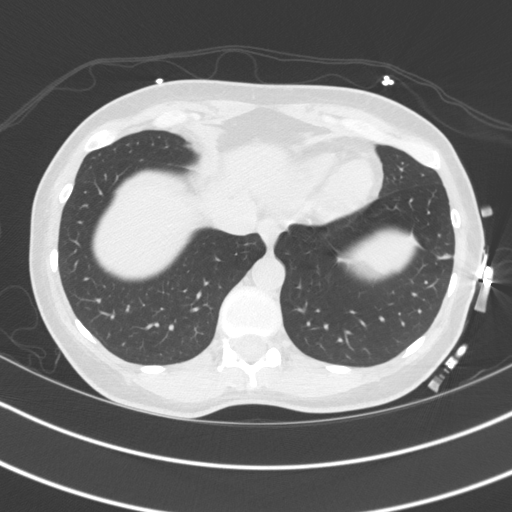
[im 53/170  lung]
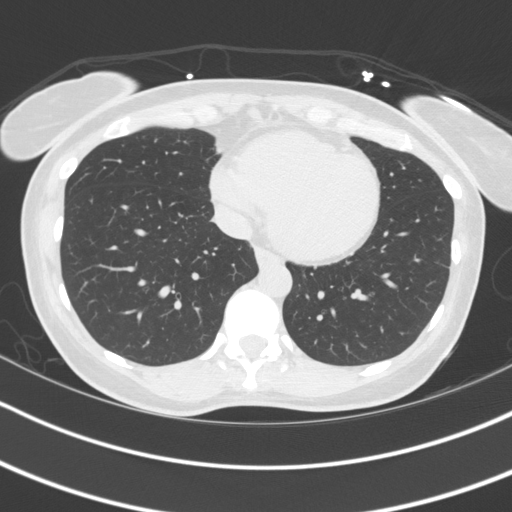
[im 66/170  mediastinal]
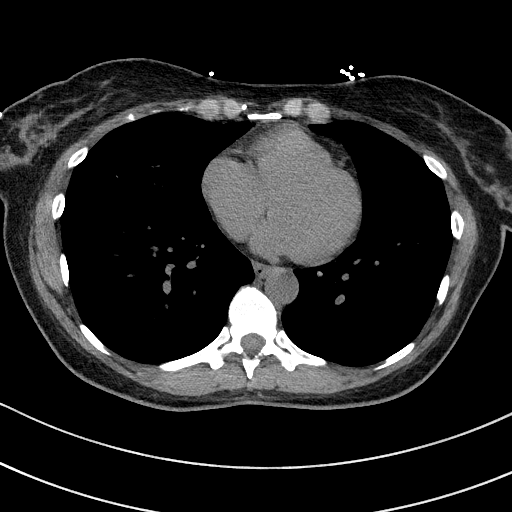
[im 66/170  lung]
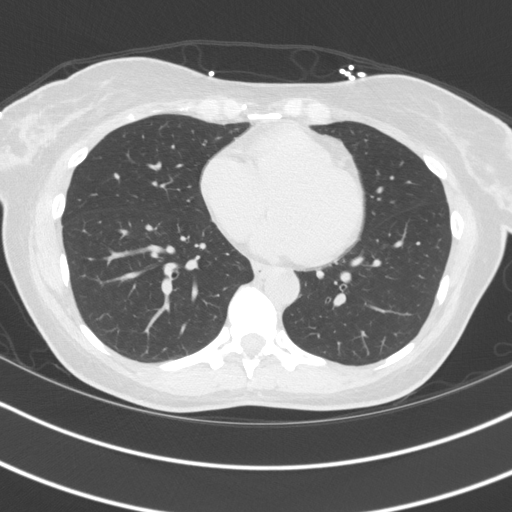
[im 79/170  lung]
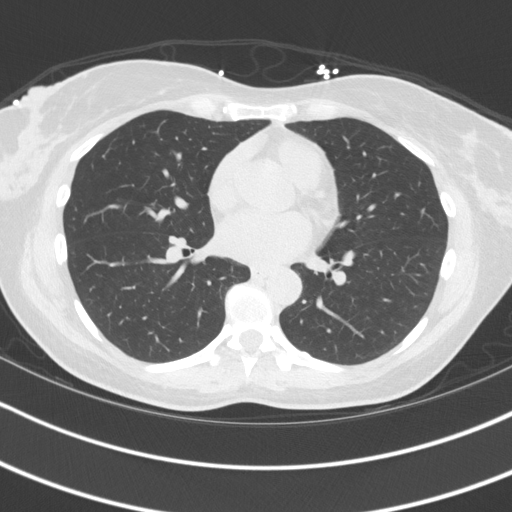
[im 92/170  lung]
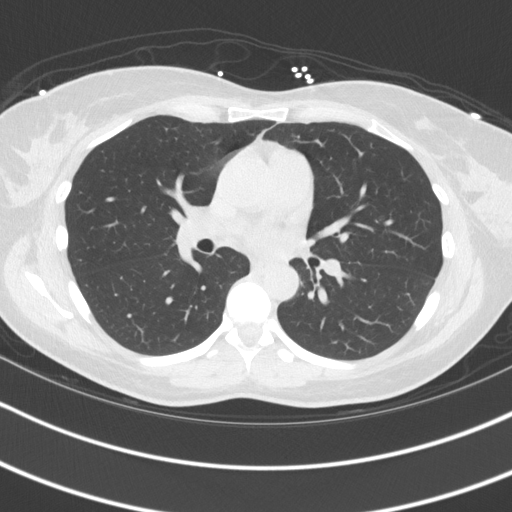
[im 105/170  lung]
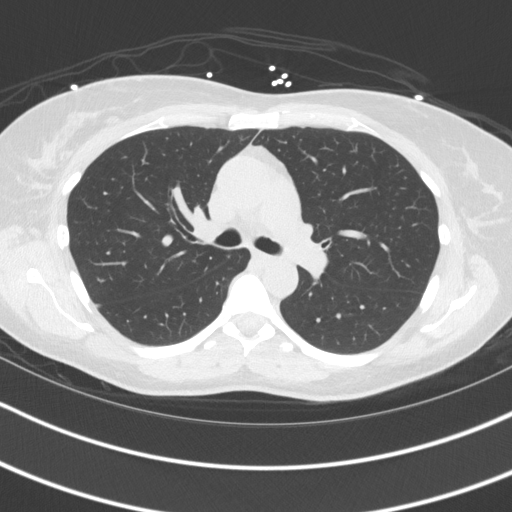
[im 118/170  mediastinal]
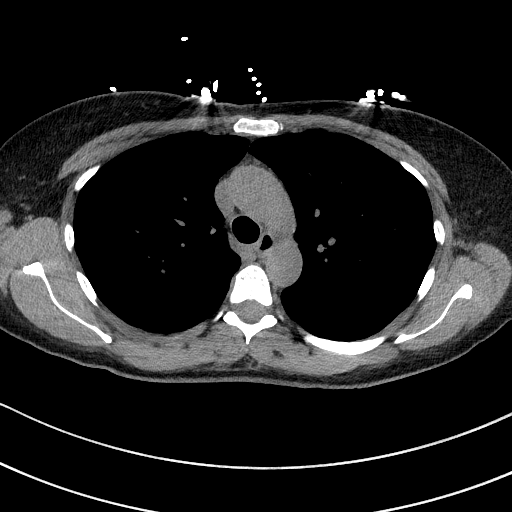
[im 118/170  lung]
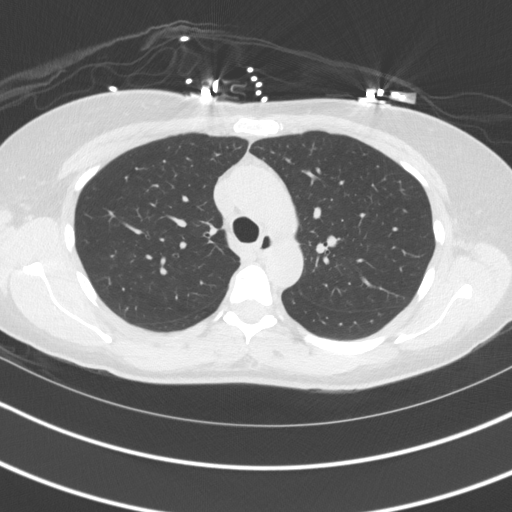
[im 131/170  lung]
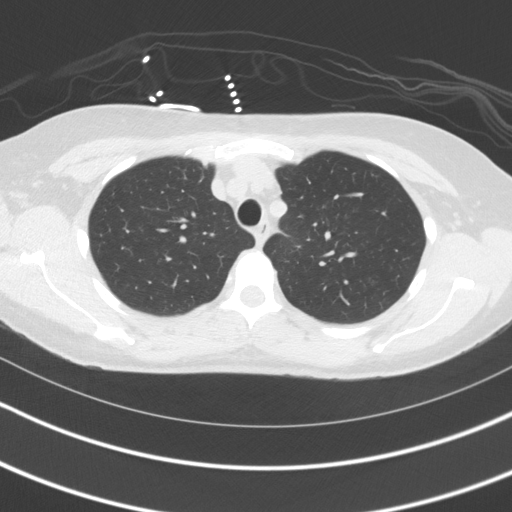
[im 144/170  lung]
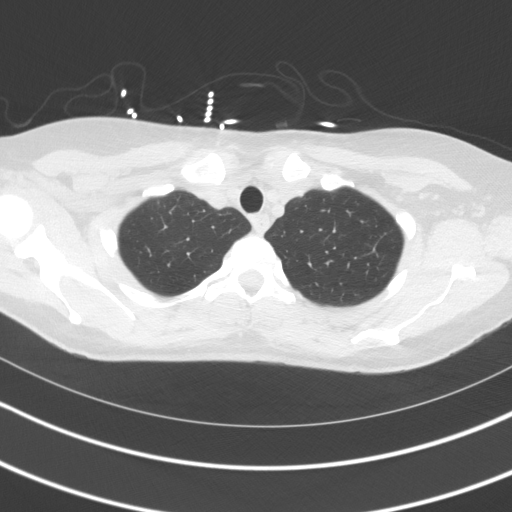
[im 157/170  lung]
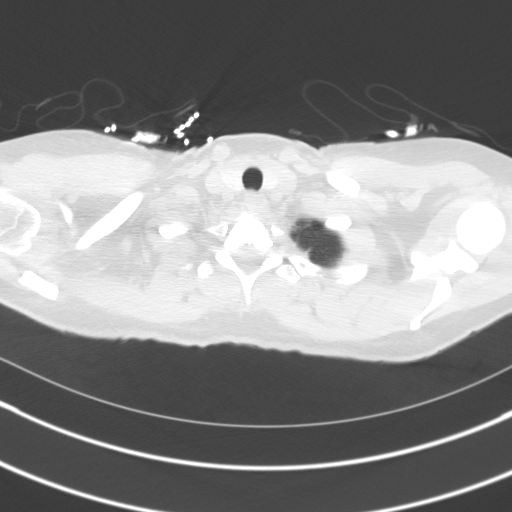

[Series 5: coronal · coronal · 0.68mm/px · 3 of 133 slices shown]
[im 27/133  lung]
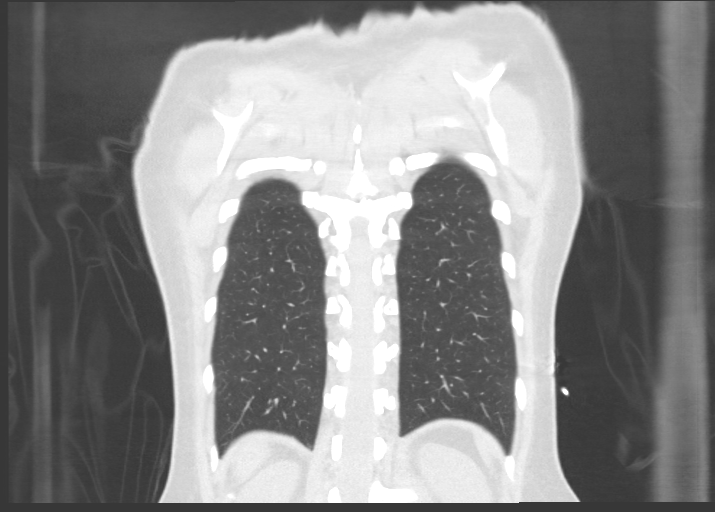
[im 53/133  lung]
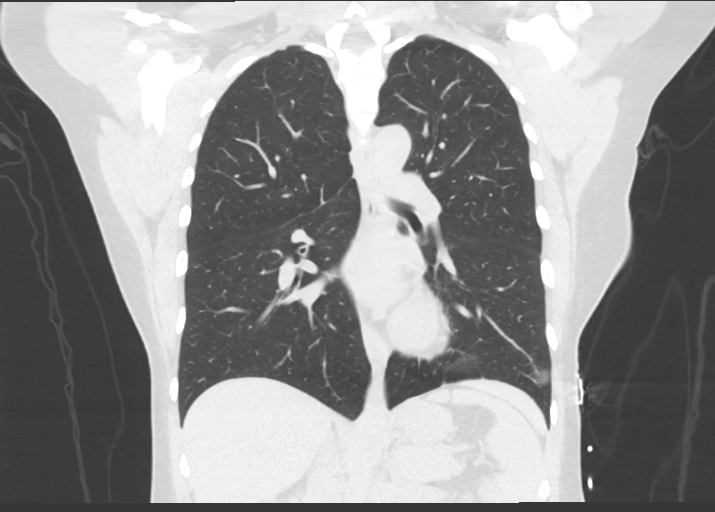
[im 80/133  lung]
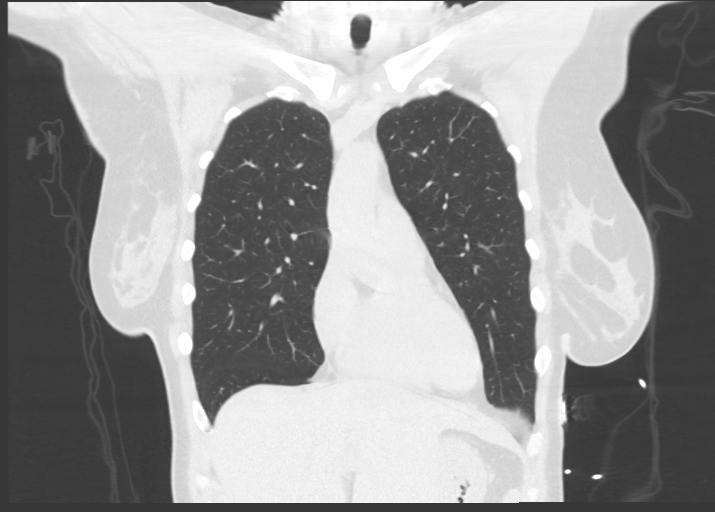

[15 of 36 positions shown; findings below may reference images not displayed]

FINDINGS: Cardiovascular: No significant vascular findings. Normal heart size.
No pericardial effusion.

Mediastinum/Nodes: No enlarged mediastinal or axillary lymph nodes.
Thyroid gland, trachea, and esophagus demonstrate no significant
findings.

Lungs/Pleura: Lungs are clear. No pleural effusion or pneumothorax.
A 0.4 cm solid nodule is noted in the left upper lobe (series 4,
image 80).

Upper Abdomen: No acute abnormality.

Musculoskeletal: No chest wall mass or suspicious bone lesions
identified. Partially visualized anterior cervical spinal fusion.
IMPRESSION: 1. No acute intrathoracic abnormality. Specifically, no pneumothorax
as queried.
2. Incidentally noted 0.4 cm solid pulmonary nodule in the left
upper lobe. No follow-up needed if patient is low-risk.This
recommendation follows the consensus statement: Guidelines for
Management of Incidental Pulmonary Nodules Detected on CT Images:

## 2023-01-27 ENCOUNTER — Emergency Department (HOSPITAL_COMMUNITY)
Admission: EM | Admit: 2023-01-27 | Discharge: 2023-01-27 | Disposition: A | Discharge status: Home / Self Care | Payer: Managed Care, Other (non HMO) | Attending: Emergency Medicine | Admitting: Emergency Medicine

## 2023-01-27 ENCOUNTER — Encounter (HOSPITAL_COMMUNITY): Payer: Self-pay

## 2023-01-27 ENCOUNTER — Other Ambulatory Visit: Payer: Self-pay

## 2023-01-27 ENCOUNTER — Emergency Department (HOSPITAL_COMMUNITY): Payer: Managed Care, Other (non HMO)

## 2023-01-27 DIAGNOSIS — R002 Palpitations: Secondary | ICD-10-CM | POA: Diagnosis present

## 2023-01-27 DIAGNOSIS — I471 Supraventricular tachycardia, unspecified: Secondary | ICD-10-CM | POA: Insufficient documentation

## 2023-01-27 LAB — CBC WITH DIFFERENTIAL/PLATELET
Abs Immature Granulocytes: 0.02 10*3/uL (ref 0.00–0.07)
Basophils Absolute: 0.1 10*3/uL (ref 0.0–0.1)
Basophils Relative: 1 %
Eosinophils Absolute: 0 10*3/uL (ref 0.0–0.5)
Eosinophils Relative: 0 %
HCT: 39.3 % (ref 36.0–46.0)
Hemoglobin: 12.9 g/dL (ref 12.0–15.0)
Immature Granulocytes: 0 %
Lymphocytes Relative: 20 %
Lymphs Abs: 2 10*3/uL (ref 0.7–4.0)
MCH: 30.6 pg (ref 26.0–34.0)
MCHC: 32.8 g/dL (ref 30.0–36.0)
MCV: 93.1 fL (ref 80.0–100.0)
Monocytes Absolute: 0.6 10*3/uL (ref 0.1–1.0)
Monocytes Relative: 7 %
Neutro Abs: 7.2 10*3/uL (ref 1.7–7.7)
Neutrophils Relative %: 72 %
Platelets: 449 10*3/uL — ABNORMAL HIGH (ref 150–400)
RBC: 4.22 MIL/uL (ref 3.87–5.11)
RDW: 13.8 % (ref 11.5–15.5)
WBC: 9.9 10*3/uL (ref 4.0–10.5)
nRBC: 0 % (ref 0.0–0.2)

## 2023-01-27 LAB — BASIC METABOLIC PANEL
Anion gap: 11 (ref 5–15)
BUN: 14 mg/dL (ref 6–20)
CO2: 21 mmol/L — ABNORMAL LOW (ref 22–32)
Calcium: 8.8 mg/dL — ABNORMAL LOW (ref 8.9–10.3)
Chloride: 104 mmol/L (ref 98–111)
Creatinine, Ser: 0.58 mg/dL (ref 0.44–1.00)
GFR, Estimated: >60 mL/min (ref >60)
Glucose, Bld: 91 mg/dL (ref 70–99)
Potassium: 3.6 mmol/L (ref 3.5–5.1)
Sodium: 136 mmol/L (ref 135–145)

## 2023-01-27 LAB — MAGNESIUM: Magnesium: 1.9 mg/dL (ref 1.7–2.4)

## 2023-01-27 NOTE — ED Provider Notes (Signed)
[place] EMERGENCY DEPARTMENT AT [facility] Provider Note   CSN: [id number] Arrival date & time: 01/27/23  1237     History  No chief complaint on file.   Heidi Nolan is a 52 y.o. female.  52 year old female with past medical history significant for SVT presents today for evaluation of palpitations. This lasted for about 20 minutes until EMS arrived on scene.  She did take metoprolol p.o. prior to EMS arrival without resolution.  She was given adenosine with EMS with return of sinus rhythm.  She is currently in sinus rhythm.  Denies any complaints at this time.  No chest pain, shortness of breath.  The history is provided by the patient. No language interpreter was used.       Home Medications Prior to Admission medications   Medication Sig Start Date End Date Taking? Authorizing Provider  amphetamine-dextroamphetamine (ADDERALL) 20 MG tablet Take 20 mg by mouth 3 (three) times daily. 12/31/20   [provider]  cetirizine (ZYRTEC) 10 MG tablet Take 10 mg by mouth daily.    [provider]  Cholecalciferol (VITAMIN D3) 1.25 MG (50000 UT) CAPS Take 1 capsule by mouth daily at 12 noon.    [provider]  metoprolol tartrate (LOPRESSOR) 25 MG tablet Take 1 tablet (25 mg total) by mouth once as needed for up to 1 dose. Take this tab as needed if you feel palpitations and your heart rate is elevated 10/01/21   Gwyneth Sprout, MD  montelukast (SINGULAIR) 10 MG tablet Take 10 mg by mouth daily. 12/26/17   [provider]  OVER THE COUNTER MEDICATION Take 1 tablet by mouth daily at 12 noon. Digestive enzymes with Prebiotic    [provider]  OVER THE COUNTER MEDICATION Take 20 mg by mouth daily at 12 noon. [employer] Acid Relief    [provider]  VENTOLIN HFA 108 (90 BASE) MCG/ACT inhaler Inhale 2 puffs into the lungs every 4 (four) hours as needed for wheezing.  08/15/12   [provider]      Allergies     Patient has no known allergies.    Review of Systems   Review of Systems  Constitutional:  Negative for fever.  Respiratory:  Negative for shortness of breath.   Cardiovascular:  Positive for palpitations. Negative for chest pain.  Gastrointestinal:  Negative for abdominal pain.  Neurological:  Negative for light-headedness.  All other systems reviewed and are negative.   Physical Exam Updated Vital Signs BP (!) 139/95   Pulse 84   Temp 98.2 F (36.8 C) (Oral)   Resp 16   Ht 5\' 5"  (1.651 m)   Wt 63.5 kg   SpO2 100%   BMI 23.30 kg/m  Physical Exam Vitals and nursing note reviewed.  Constitutional:      General: She is not in acute distress.    Appearance: Normal appearance. She is not ill-appearing.  HENT:     Head: Normocephalic and atraumatic.     Nose: Nose normal.  Eyes:     General: No scleral icterus.    Extraocular Movements: Extraocular movements intact.     Conjunctiva/sclera: Conjunctivae normal.  Cardiovascular:     Rate and Rhythm: Normal rate and regular rhythm.     Heart sounds: Normal heart sounds.  Pulmonary:     Effort: Pulmonary effort is normal. No respiratory distress.     Breath sounds: Normal breath sounds. No wheezing or rales.  Abdominal:  General: There is no distension.     Tenderness: There is no abdominal tenderness.  Musculoskeletal:        General: Normal range of motion.     Cervical back: Normal range of motion.  Skin:    General: Skin is warm and dry.  Neurological:     General: No focal deficit present.     Mental Status: She is alert and oriented to person, place, and time. Mental status is at baseline.     ED Results / Procedures / Treatments   Labs (all labs ordered are listed, but only abnormal results are displayed) Labs Reviewed  BASIC METABOLIC PANEL  CBC WITH DIFFERENTIAL/PLATELET  MAGNESIUM    EKG None  Radiology No results found.  Procedures Procedures    Medications Ordered in ED Medications -  No data to display  ED Course/ Medical Decision Making/ A&P                                 Medical Decision Making Amount and/or Complexity of Data Reviewed Labs: ordered. Radiology: ordered.   52 year old female presents today for evaluation of palpitations.  Found to be in SVT with EMS and given adenosine with conversion to normal sinus rhythm.  No symptoms prior to going into SVT.  Currently asymptomatic.  Will obtain basic labs, chest x-ray to ensure electrolytes are within normal and no other acute concern that might of sent patient into SVT.  She is agreeable.  CBC without leukocytosis or anemia.  BMP without renal insufficiency and normal electrolytes.  Chest x-ray without acute cardiopulmonary process.  EKG without acute ischemic changes.   Final Clinical Impression(s) / ED Diagnoses Final diagnoses:  SVT (supraventricular tachycardia)    Rx / DC Orders ED Discharge Orders     None         Marita Kansas, PA-C 01/27/23 1529    Elayne Snare K, DO 01/27/23 1557

## 2023-01-27 NOTE — ED Triage Notes (Signed)
Pt BIB EMS due to SVT. Pt was at home and felt palpitations, HR was 200. Pt took 25mg  of metoprolol, 324mg  of aspirin, 6 of adenosine. Pt arrives in NSR at 80 bpm. axox4

## 2023-01-27 NOTE — Discharge Instructions (Addendum)
Your workup today is overall reassuring.  Electrolytes are normal.  Chest x-ray does not show any concerns.  Follow-up with your cardiologist.  For any concerning symptoms return to the emergency room.

## 2023-03-22 ENCOUNTER — Other Ambulatory Visit: Payer: Self-pay | Admitting: Gynecology

## 2023-03-22 DIAGNOSIS — R928 Other abnormal and inconclusive findings on diagnostic imaging of breast: Secondary | ICD-10-CM

## 2023-03-29 ENCOUNTER — Ambulatory Visit: Payer: Self-pay | Admitting: Cardiology

## 2023-04-06 ENCOUNTER — Ambulatory Visit
Admission: RE | Admit: 2023-04-06 | Discharge: 2023-04-06 | Disposition: A | Discharge status: Home / Self Care | Payer: Managed Care, Other (non HMO) | Source: Ambulatory Visit | Attending: Gynecology | Admitting: Gynecology

## 2023-04-06 ENCOUNTER — Other Ambulatory Visit: Payer: Self-pay | Admitting: Gynecology

## 2023-04-06 ENCOUNTER — Ambulatory Visit
Admission: RE | Admit: 2023-04-06 | Discharge: 2023-04-06 | Disposition: A | Discharge status: Home / Self Care | Payer: No Typology Code available for payment source | Source: Ambulatory Visit | Attending: Gynecology | Admitting: Gynecology

## 2023-04-06 DIAGNOSIS — R928 Other abnormal and inconclusive findings on diagnostic imaging of breast: Secondary | ICD-10-CM

## 2023-04-06 DIAGNOSIS — N6320 Unspecified lump in the left breast, unspecified quadrant: Secondary | ICD-10-CM

## 2023-04-06 DIAGNOSIS — N6489 Other specified disorders of breast: Secondary | ICD-10-CM

## 2023-04-08 ENCOUNTER — Ambulatory Visit
Admission: RE | Admit: 2023-04-08 | Discharge: 2023-04-08 | Disposition: A | Discharge status: Home / Self Care | Payer: Managed Care, Other (non HMO) | Source: Ambulatory Visit | Attending: Gynecology | Admitting: Gynecology

## 2023-04-08 DIAGNOSIS — N6320 Unspecified lump in the left breast, unspecified quadrant: Secondary | ICD-10-CM

## 2023-04-08 DIAGNOSIS — N6489 Other specified disorders of breast: Secondary | ICD-10-CM

## 2023-04-08 HISTORY — PX: BREAST BIOPSY: SHX20

## 2023-04-10 NOTE — Progress Notes (Unsigned)
Cardiology Office Note   Date:  04/13/2023   ID:  Heidi Nolan, Heidi Nolan 1970-08-08, MRN [id number]  PCP:  Soundra Pilon, FNP  Cardiologist:   None Referring:  Soundra Pilon, FNP  Chief Complaint  Patient presents with   Palpitations      History of Present Illness: Heidi Nolan is a 52 y.o. female who was previously seen by another cardiology group.  She was seen in 2022.  Marland Kitchen  Her calcium score in 2022 was zero.   Echo was normal at that time.    She was also treated for SVT.     I saw her in 2019 for chest pain.  This was thought to be nonanginal.  She had SVT when I saw her.   She presents again with recurrent tachypalpitations.  She called EMS recently and I was able to review these records.  She was in the emergency room.  This was in August.  She said she tried her vagal maneuvers.  She took metoprolol.  It did not help.  She was given adenosine.  I was able to review EMS records and the ED records.  EMS there was documented narrow complex tachycardia.  The fastest rate was 200.  She was in sinus rhythm in the emergency room.  Electrolytes were unremarkable.  She had recent magnesium that was unremarkable.  She says that overall she had about 10 episodes over 5 or 6 years.  She sometimes able to break with bigger episodes with a vagal maneuver.  She has some smaller episodes that do not last very long.  This 1 was associated with presyncope.  She did not have chest pressure, neck or arm discomfort.  She felt weak.  She does not think it was a trigger.  She was just doing her usual work.  She is an Conservation officer, historic buildings.  She is otherwise physically active and she denies any cardiovascular symptoms.  She has had no shortness of breath, PND or orthopnea.    Of note she was just told the other day that she has breast cancer and she is about to have consultation to understand what the plan is.   Past Medical History:  Diagnosis Date   Abnormal Pap smear of cervix    Cervical disc  disease    SVT (supraventricular tachycardia) (HCC)     Past Surgical History:  Procedure Laterality Date   BREAST BIOPSY Left 04/08/2023   [place] LT BREAST BX W LOC DEV 1ST LESION IMG BX SPEC US GUIDE 04/08/2023 [facility]   CESAREAN SECTION  1991   NECK SURGERY  9/14   [facility]     Current Outpatient Medications  Medication Sig Dispense Refill   amphetamine-dextroamphetamine (ADDERALL) 20 MG tablet Take 20 mg by mouth 3 (three) times daily.     cetirizine (ZYRTEC) 10 MG tablet Take 10 mg by mouth daily.     Cholecalciferol (VITAMIN D3) 1.25 MG (50000 UT) CAPS Take 1 capsule by mouth daily at 12 noon.     metoprolol tartrate (LOPRESSOR) 25 MG tablet Take 1 tablet (25 mg total) by mouth once as needed for up to 1 dose. Take this tab as needed if you feel palpitations and your heart rate is elevated 15 tablet 0   montelukast (SINGULAIR) 10 MG tablet Take 10 mg by mouth daily.  3   OVER THE COUNTER MEDICATION Take 1 tablet by mouth daily at 12 noon. Digestive enzymes with Prebiotic  OVER THE COUNTER MEDICATION Take 20 mg by mouth daily at 12 noon. [employer] Acid Relief     VENTOLIN HFA 108 (90 BASE) MCG/ACT inhaler Inhale 2 puffs into the lungs every 4 (four) hours as needed for wheezing.      vitamin B-12 (CYANOCOBALAMIN) 100 MCG tablet Take 100 mcg by mouth daily.     No current facility-administered medications for this visit.    Allergies:   Patient has no known allergies.    Social History:  The patient  reports that she has been smoking cigarettes. She has a 15 pack-year smoking history. She has never used smokeless tobacco. She reports current alcohol use of about 5.0 - 7.0 standard drinks of alcohol per week. She reports that she does not use drugs.   Family History:  The patient's family history includes Cancer in her paternal grandfather; Heart attack (age of onset: 27) in her father; Osteoarthritis in her mother.    ROS:  Please see the history of present illness.    Otherwise, review of systems are positive for none.   All other systems are reviewed and negative.    PHYSICAL EXAM: VS:  BP 120/82   Pulse 72   Ht 5\' 5"  (1.651 m)   Wt 139 lb 12.8 oz (63.4 kg)   LMP 04/03/2023   SpO2 100%   BMI 23.26 kg/m  , BMI Body mass index is 23.26 kg/m. GENERAL:  Well appearing HEENT:  Pupils equal round and reactive, fundi not visualized, oral mucosa unremarkable NECK:  No jugular venous distention, waveform within normal limits, carotid upstroke brisk and symmetric, no bruits, no thyromegaly LYMPHATICS:  No cervical, inguinal adenopathy LUNGS:  Clear to auscultation bilaterally BACK:  No CVA tenderness CHEST:  Unremarkable HEART:  PMI not displaced or sustained,S1 and S2 within normal limits, no S3, no S4, no clicks, no rubs, no murmurs ABD:  Flat, positive bowel sounds normal in frequency in pitch, no bruits, no rebound, no guarding, no midline pulsatile mass, no hepatomegaly, no splenomegaly EXT:  2 plus pulses throughout, no edema, no cyanosis no clubbing SKIN:  No rashes no nodules NEURO:  Cranial nerves II through XII grossly intact, motor grossly intact throughout [facility]:  Cognitively intact, oriented to person place and time    EKG:  EKG Interpretation Date/Time:  Wednesday April 13 2023 13:36:37 EDT Ventricular Rate:  72 PR Interval:  150 QRS Duration:  90 QT Interval:  398 QTC Calculation: 435 R Axis:   77  Text Interpretation: Normal sinus rhythm When compared with ECG of 27-Jan-2023 12:47, No significant change since last tracing Confirmed by Rollene Rotunda ([id number]) on 04/13/2023 2:03:27 PM     Recent Labs: 01/27/2023: BUN 14; Creatinine, Ser 0.58; Hemoglobin 12.9; Magnesium 1.9; Platelets 449; Potassium 3.6; Sodium 136    Lipid Panel    Component Value Date/Time   CHOL 194 01/23/2021 0829   TRIG 89 01/23/2021 0829   HDL 97 01/23/2021 0829   LDLCALC 81 01/23/2021 0829   LDLDIRECT 86 01/23/2021 0829      Wt Readings from  Last 3 Encounters:  04/13/23 139 lb 12.8 oz (63.4 kg)  01/27/23 140 lb (63.5 kg)  10/01/21 130 lb (59 kg)      Other studies Reviewed: Additional studies/ records that were reviewed today include: ED, EMS . Review of the above records demonstrates:  Please see elsewhere in the note.     ASSESSMENT AND PLAN:  SVT: The patient's had recurrent SVT and would be  interested in ablation.  However, she wants to find out what is going on with her breast cancer first.  Told her to call me when she is ready to have this appointment scheduled and I would refer her to EP.  At this time we talked again about vagal maneuvers.  We talked about as needed use of the beta-blocker.  She is not noticing any triggers but will look for these.   Current medicines are reviewed at length with the patient today.  The patient does not have concerns regarding medicines.  The following changes have been made:  no change  Labs/ tests ordered today include:   Orders Placed This Encounter  Procedures   EKG 12-Lead     Disposition:   FU with me as needed.      Signed, Rollene Rotunda, MD  04/13/2023 2:10 PM    [facility]

## 2023-04-11 LAB — SURGICAL PATHOLOGY

## 2023-04-12 ENCOUNTER — Telehealth: Payer: Self-pay | Admitting: Hematology and Oncology

## 2023-04-12 ENCOUNTER — Telehealth: Payer: Self-pay | Admitting: *Deleted

## 2023-04-12 NOTE — Telephone Encounter (Signed)
LVM in reference to [facility] on 11/6, left my call back number for a return call

## 2023-04-12 NOTE — Telephone Encounter (Signed)
Spoke to patient to confirm upcoming afternoon [facility] clinic appointment on 11/6, paperwork will be sent via e-mail.   Gave location and time, also informed patient that the surgeon's office would be calling as well to get information from them similar to the packet that they will be receiving so make sure to do both.  Reminded patient that all providers will be coming to the clinic to see them HERE and if they had any questions to not hesitate to reach back out to myself or their navigators.

## 2023-04-13 ENCOUNTER — Encounter: Payer: Self-pay | Admitting: Cardiology

## 2023-04-13 ENCOUNTER — Ambulatory Visit: Payer: Managed Care, Other (non HMO) | Attending: Cardiology | Admitting: Cardiology

## 2023-04-13 VITALS — BP 120/82 | HR 72 | Ht 65.0 in | Wt 139.8 lb

## 2023-04-13 DIAGNOSIS — I471 Supraventricular tachycardia, unspecified: Secondary | ICD-10-CM | POA: Diagnosis not present

## 2023-04-13 NOTE — Patient Instructions (Signed)
Medication Instructions:  Continue same medications   Lab Work: None ordered   Testing/Procedures: None ordered   Follow-Up: At [facility], you and your health needs are our priority.  As part of our continuing mission to provide you with exceptional heart care, we have created designated Provider Care Teams.  These Care Teams include your primary Cardiologist (physician) and Advanced Practice Providers (APPs -  Physician Assistants and Nurse Practitioners) who all work together to provide you with the care you need, when you need it.  We recommend signing up for the patient portal called "MyChart".  Sign up information is provided on this After Visit Summary.  MyChart is used to connect with patients for Virtual Visits (Telemedicine).  Patients are able to view lab/test results, encounter notes, upcoming appointments, etc.  Non-urgent messages can be sent to your provider as well.   To learn more about what you can do with MyChart, go to [email or web address].    Your next appointment:  As Needed    Provider:  Dr.Hochrein

## 2023-04-18 ENCOUNTER — Encounter: Payer: Self-pay | Admitting: *Deleted

## 2023-04-18 DIAGNOSIS — C50412 Malignant neoplasm of upper-outer quadrant of left female breast: Secondary | ICD-10-CM | POA: Insufficient documentation

## 2023-04-20 ENCOUNTER — Encounter: Payer: Self-pay | Admitting: General Practice

## 2023-04-20 ENCOUNTER — Other Ambulatory Visit: Payer: Self-pay

## 2023-04-20 ENCOUNTER — Encounter: Payer: Self-pay | Admitting: Genetic Counselor

## 2023-04-20 ENCOUNTER — Encounter: Payer: Self-pay | Admitting: Physical Therapy

## 2023-04-20 ENCOUNTER — Inpatient Hospital Stay: Payer: Managed Care, Other (non HMO) | Admitting: Genetic Counselor

## 2023-04-20 ENCOUNTER — Inpatient Hospital Stay: Payer: Managed Care, Other (non HMO) | Attending: Hematology and Oncology

## 2023-04-20 ENCOUNTER — Other Ambulatory Visit: Payer: Self-pay | Admitting: General Surgery

## 2023-04-20 ENCOUNTER — Encounter: Payer: Self-pay | Admitting: *Deleted

## 2023-04-20 ENCOUNTER — Ambulatory Visit
Admission: RE | Admit: 2023-04-20 | Discharge: 2023-04-20 | Disposition: A | Discharge status: Home / Self Care | Payer: Managed Care, Other (non HMO) | Source: Ambulatory Visit | Attending: Radiation Oncology | Admitting: Radiation Oncology

## 2023-04-20 ENCOUNTER — Inpatient Hospital Stay (HOSPITAL_BASED_OUTPATIENT_CLINIC_OR_DEPARTMENT_OTHER): Payer: Managed Care, Other (non HMO) | Admitting: Hematology and Oncology

## 2023-04-20 ENCOUNTER — Ambulatory Visit: Payer: Managed Care, Other (non HMO) | Attending: General Surgery | Admitting: Physical Therapy

## 2023-04-20 VITALS — BP 132/85 | HR 71 | Temp 97.5°F | Resp 18 | Ht 65.0 in | Wt 139.3 lb

## 2023-04-20 DIAGNOSIS — Z483 Aftercare following surgery for neoplasm: Secondary | ICD-10-CM | POA: Insufficient documentation

## 2023-04-20 DIAGNOSIS — Z170 Estrogen receptor positive status [ER+]: Secondary | ICD-10-CM | POA: Insufficient documentation

## 2023-04-20 DIAGNOSIS — Z801 Family history of malignant neoplasm of trachea, bronchus and lung: Secondary | ICD-10-CM

## 2023-04-20 DIAGNOSIS — Z809 Family history of malignant neoplasm, unspecified: Secondary | ICD-10-CM

## 2023-04-20 DIAGNOSIS — C50412 Malignant neoplasm of upper-outer quadrant of left female breast: Secondary | ICD-10-CM

## 2023-04-20 DIAGNOSIS — R293 Abnormal posture: Secondary | ICD-10-CM | POA: Diagnosis not present

## 2023-04-20 DIAGNOSIS — Z79899 Other long term (current) drug therapy: Secondary | ICD-10-CM | POA: Diagnosis not present

## 2023-04-20 DIAGNOSIS — Z803 Family history of malignant neoplasm of breast: Secondary | ICD-10-CM

## 2023-04-20 DIAGNOSIS — Z800 Family history of malignant neoplasm of digestive organs: Secondary | ICD-10-CM

## 2023-04-20 DIAGNOSIS — F17210 Nicotine dependence, cigarettes, uncomplicated: Secondary | ICD-10-CM | POA: Diagnosis not present

## 2023-04-20 LAB — CBC WITH DIFFERENTIAL (CANCER CENTER ONLY)
Abs Immature Granulocytes: 0.02 10*3/uL (ref 0.00–0.07)
Basophils Absolute: 0.1 10*3/uL (ref 0.0–0.1)
Basophils Relative: 1 %
Eosinophils Absolute: 0.1 10*3/uL (ref 0.0–0.5)
Eosinophils Relative: 1 %
HCT: 39.5 % (ref 36.0–46.0)
Hemoglobin: 13.7 g/dL (ref 12.0–15.0)
Immature Granulocytes: 0 %
Lymphocytes Relative: 37 %
Lymphs Abs: 2.6 10*3/uL (ref 0.7–4.0)
MCH: 31.8 pg (ref 26.0–34.0)
MCHC: 34.7 g/dL (ref 30.0–36.0)
MCV: 91.6 fL (ref 80.0–100.0)
Monocytes Absolute: 0.6 10*3/uL (ref 0.1–1.0)
Monocytes Relative: 9 %
Neutro Abs: 3.6 10*3/uL (ref 1.7–7.7)
Neutrophils Relative %: 52 %
Platelet Count: 377 10*3/uL (ref 150–400)
RBC: 4.31 MIL/uL (ref 3.87–5.11)
RDW: 13.1 % (ref 11.5–15.5)
WBC Count: 7 10*3/uL (ref 4.0–10.5)
nRBC: 0 % (ref 0.0–0.2)

## 2023-04-20 LAB — CMP (CANCER CENTER ONLY)
ALT: 19 U/L (ref 0–44)
AST: 17 U/L (ref 15–41)
Albumin: 4.5 g/dL (ref 3.5–5.0)
Alkaline Phosphatase: 51 U/L (ref 38–126)
Anion gap: 4 — ABNORMAL LOW (ref 5–15)
BUN: 21 mg/dL — ABNORMAL HIGH (ref 6–20)
CO2: 31 mmol/L (ref 22–32)
Calcium: 9.4 mg/dL (ref 8.9–10.3)
Chloride: 102 mmol/L (ref 98–111)
Creatinine: 0.54 mg/dL (ref 0.44–1.00)
GFR, Estimated: >60 mL/min (ref >60)
Glucose, Bld: 95 mg/dL (ref 70–99)
Potassium: 4 mmol/L (ref 3.5–5.1)
Sodium: 137 mmol/L (ref 135–145)
Total Bilirubin: 0.5 mg/dL (ref <1.2)
Total Protein: 7 g/dL (ref 6.5–8.1)

## 2023-04-20 LAB — GENETIC SCREENING ORDER

## 2023-04-20 NOTE — Progress Notes (Signed)
Radiation Oncology         [phone] ________________________________  Name: Heidi Nolan        MRN: [id number]  Date of Service: 04/20/2023 DOB: Aug 19, 1970  EX:BMWUX, Resa Miner, FNP  Emelia Loron, MD     REFERRING PHYSICIAN: Emelia Loron, MD   DIAGNOSIS: The encounter diagnosis was Malignant neoplasm of upper-outer quadrant of left breast in female, estrogen receptor positive (HCC).   HISTORY OF PRESENT ILLNESS: Heidi Nolan is a 52 y.o. female seen in the multidisciplinary breast clinic for a new diagnosis of left breast cancer. The patient was noted to have screening detected left breast mass.  Further workup with diagnostic imaging on 04/06/2023 showed a mass in the 1 o'clock position with associated calcifications by mammography, the mass in the 1 o'clock position 7 cm from the nipple measuring 1.5 cm.  The associated area in the 1 o'clock position adjacent to the other measuring 1.1 cm, the total span 2.6 cm.  Her axilla was negative for adenopathy.  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  She is seen today to discuss treatment recommendations of her cancer.    PREVIOUS RADIATION THERAPY: No   PAST MEDICAL HISTORY:  Past Medical History:  Diagnosis Date   Abnormal Pap smear of cervix    Cervical disc disease    SVT (supraventricular tachycardia) (HCC)        PAST SURGICAL HISTORY: Past Surgical History:  Procedure Laterality Date   BREAST BIOPSY Left 04/08/2023   [place] LT BREAST BX W LOC DEV 1ST LESION IMG BX SPEC US GUIDE 04/08/2023 [facility] MAMMOGRAP[facility]   CESAREAN SECTION  1991   NECK SURGERY  9/14   [facility]     FAMILY HISTORY:  Family History  Problem Relation Age of Onset   Osteoarthritis Mother    Heart attack Father 70       Died of MI   Cancer Paternal Grandfather       SOCIAL HISTORY:  reports that she has been smoking cigarettes. She has a 15 pack-year smoking history. She has never used smokeless tobacco. She reports current alcohol use of about 5.0 - 7.0 standard drinks of alcohol per week. She reports that she does not use drugs. The patient is married and [place]  She is a Investment banker, operational and owns a [employer] She's accompanied by her husband.   ALLERGIES: Patient has no known allergies.   MEDICATIONS:  Current Outpatient Medications  Medication Sig Dispense Refill   amphetamine-dextroamphetamine (ADDERALL) 20 MG tablet Take 20 mg by mouth 3 (three) times daily.     cetirizine (ZYRTEC) 10 MG tablet Take 10 mg by mouth daily.     Cholecalciferol (VITAMIN D3) 1.25 MG (50000 UT) CAPS Take 1 capsule by mouth daily at 12 noon.     metoprolol tartrate (LOPRESSOR) 25 MG tablet Take 1 tablet (25 mg total) by mouth once as needed for up to 1 dose. Take this tab as needed if you feel palpitations and your heart rate is elevated 15 tablet 0   montelukast (SINGULAIR) 10 MG tablet Take 10 mg by mouth daily.  3   OVER THE COUNTER MEDICATION Take 1 tablet by mouth daily at 12 noon. Digestive enzymes with Prebiotic     OVER THE COUNTER MEDICATION Take  20 mg by mouth daily at 12 noon. [employer] Acid Relief     VENTOLIN HFA 108 (90 BASE) MCG/ACT inhaler Inhale 2 puffs into the lungs every 4 (four) hours as needed for wheezing.      vitamin B-12 (CYANOCOBALAMIN) 100 MCG tablet Take 100 mcg by mouth daily.     No current facility-administered medications for this visit.     REVIEW OF SYSTEMS: On review of systems, the patient reports that she is doing well overall. No breast specific complaints are verbalized.      PHYSICAL EXAM:  Wt Readings from Last 3 Encounters:  04/13/23 139 lb 12.8 oz (63.4 kg)  01/27/23 140 lb (63.5 kg)  10/01/21 130 lb (59 kg)   Temp Readings from Last 3 Encounters:  01/27/23 98.2 F (36.8 C) (Oral)   10/01/21 97.6 F (36.4 C) (Oral)  01/02/21 98.2 F (36.8 C) (Temporal)   BP Readings from Last 3 Encounters:  04/13/23 120/82  01/27/23 (!) 131/90  10/01/21 (!) 113/93   Pulse Readings from Last 3 Encounters:  04/13/23 72  01/27/23 64  10/01/21 70    In general this is a well appearing caucasian female in no acute distress. She's alert and oriented x4 and appropriate throughout the examination. Cardiopulmonary assessment is negative for acute distress and she exhibits normal effort. Bilateral breast exam is deferred.    ECOG = 1  0 - Asymptomatic (Fully active, able to carry on all predisease activities without restriction)  1 - Symptomatic but completely ambulatory (Restricted in physically strenuous activity but ambulatory and able to carry out work of a light or sedentary nature. For example, light housework, office work)  2 - Symptomatic, <50% in bed during the day (Ambulatory and capable of all self care but unable to carry out any work activities. Up and about more than 50% of waking hours)  3 - Symptomatic, >50% in bed, but not bedbound (Capable of only limited self-care, confined to bed or chair 50% or more of waking hours)  4 - Bedbound (Completely disabled. Cannot carry on any self-care. Totally confined to bed or chair)  5 - Death   Santiago Glad MM, Creech RH, Tormey DC, et al. [id number]). "Toxicity and response criteria of the [facility]". Am. Evlyn Clines. Oncol. 5 (6): 649-55    LABORATORY DATA:  Lab Results  Component Value Date   WBC 9.9 01/27/2023   HGB 12.9 01/27/2023   HCT 39.3 01/27/2023   MCV 93.1 01/27/2023   PLT 449 (H) 01/27/2023   Lab Results  Component Value Date   NA 136 01/27/2023   K 3.6 01/27/2023   CL 104 01/27/2023   CO2 21 (L) 01/27/2023   Lab Results  Component Value Date   ALT 26 01/23/2021   AST 20 01/23/2021   ALKPHOS 54 01/23/2021   BILITOT 0.4 01/23/2021      RADIOGRAPHY: [place] LT BREAST BX W LOC DEV 1ST  LESION IMG BX SPEC US GUIDE  Addendum Date: 04/12/2023   ADDENDUM REPORT: 04/12/2023 11:16 ADDENDUM: Pathology revealed: GRADE I INVASIVE DUCTAL CARCINOMA of the LEFT breast, 1 o'clock, 7 cmfn, (ribbon clip). This was found to be concordant by Dr. Quincy Carnes. Pathology results were discussed with the patient by telephone. The patient reported doing well after the biopsy with tenderness at the site. Post biopsy instructions and care were reviewed and questions were answered. The patient was encouraged to call [facility] for any additional concerns. My  direct phone number was provided. The patient was referred to [facility] on April 20, 2023. Pathology results reported by Rene Kocher, RN on 04/11/2023. Electronically Signed   By: Hulan Saas M.D.   On: 04/12/2023 11:16   Result Date: 04/12/2023 CLINICAL DATA:  52 year old with screening detected adjacent highly suspicious masses in the UPPER OUTER QUADRANT of the LEFT breast at 1 o'clock 7 cm from the nipple. The individual masses measure approximately 1.5 cm and 1.1 cm, spanning a total of 2.6 cm, and are sampled as a single mass due to their very close proximity. EXAM: ULTRASOUND GUIDED LEFT BREAST CORE NEEDLE BIOPSY COMPARISON:  Previous exam(s). PROCEDURE: I met with the patient and we discussed the procedure of ultrasound-guided biopsy, including benefits and alternatives. We discussed the high likelihood of a successful procedure. We discussed the risks of the procedure, including infection, bleeding, tissue injury, clip migration, and inadequate sampling. Informed written consent was given. The usual time-out protocol was performed immediately prior to the procedure. Lesion quadrant: UPPER OUTER QUADRANT. Using sterile technique with chlorhexidine as skin antisepsis, 1% Lidocaine as local anesthetic, under direct ultrasound visualization,  a 12 gauge Bard Marquee core needle device placed through an 11 gauge introducer was used to perform biopsy of the mass in the UPPER OUTER QUADRANT using a lateral approach. At the conclusion of the procedure, a ribbon shaped tissue marker clip was deployed into the biopsy cavity. The patient tolerated the procedure well without apparent immediate complications. Follow up 2 view mammogram was performed in order to confirm clip placement and was dictated separately. IMPRESSION: Ultrasound guided core needle biopsy of adjacent highly suspicious masses in the UPPER OUTER QUADRANT of the LEFT breast, sampled as a single mass due to their very close proximity. Electronically Signed: By: Hulan Saas M.D. On: 04/08/2023 08:21   MM CLIP PLACEMENT LEFT  Result Date: 04/08/2023 CLINICAL DATA:  Confirmation of clip placement after ultrasound-guided core needle biopsy of adjacent highly suspicious masses in the UPPER OUTER QUADRANT of the LEFT breast, sampled as a single mass due to their very close proximity. EXAM: 2D and 3D DIAGNOSTIC LEFT MAMMOGRAM POST ULTRASOUND BIOPSY COMPARISON:  Previous exam(s). FINDINGS: 2D and 3D full field CC and mediolateral mammographic images were obtained following ultrasound guided biopsy of adjacent masses in the UPPER OUTER QUADRANT of the LEFT breast. The ribbon shaped tissue marking clip is appropriately positioned within the biopsied mass at its posterosuperior margin. Expected post biopsy changes are present without evidence of hematoma. IMPRESSION: Appropriate positioning of the ribbon shaped tissue marking clip within the biopsied mass in the UPPER OUTER QUADRANT of the LEFT breast. Final Assessment: Post Procedure Mammograms for Marker Placement Electronically Signed   By: Hulan Saas M.D.   On: 04/08/2023 08:20   MM 3D DIAGNOSTIC MAMMOGRAM UNILATERAL LEFT BREAST  Result Date: 04/06/2023 CLINICAL DATA:  Patient returns after screening for evaluation of possible LEFT  breast mass. EXAM: DIGITAL DIAGNOSTIC UNILATERAL LEFT MAMMOGRAM WITH TOMOSYNTHESIS AND CAD; ULTRASOUND LEFT BREAST LIMITED TECHNIQUE: Left digital diagnostic mammography and breast tomosynthesis was performed. The images were evaluated with computer-aided detection. ; Targeted ultrasound examination of the left breast was performed. COMPARISON:  Previous exam(s). ACR Breast Density Category c: The breasts are heterogeneously dense, which may obscure small masses. FINDINGS: Additional 2-D and 3-D images are performed. These views confirm presence of a spiculated mass in the UPPER-OUTER QUADRANT of the LEFT breast. There are associated  pleomorphic calcifications. On physical exam, I palpate soft focal thickening in the 1 o'clock location of the LEFT breast. Targeted ultrasound is performed, showing an irregular hypoechoic mass with spiculated margins and internal blood flow in the 1 o'clock location of the LEFT breast 7 centimeters from the nipple. Mass is 1.4 x 0.9 x 1.5 centimeters. In adjacent similar appearing mass is 0.7 x 1.1 x 0.7 centimeters. This mass also has internal blood flow. Calcifications are identified within both lesions. Measured together, the masses span 2.6 centimeters. Evaluation of the LEFT axilla is negative for adenopathy. IMPRESSION: Adjacent similar masses in the 1 o'clock location of the LEFT breast spanning 2.6 centimeters. Given the close proximity, these lesions can be biopsied as a single lesion. No LEFT axillary adenopathy. RECOMMENDATION: Ultrasound-guided core biopsy of LEFT breast mass. I have discussed the findings and recommendations with the patient. If applicable, a reminder letter will be sent to the patient regarding the next appointment. BI-RADS CATEGORY  5: Highly suggestive of malignancy. Electronically Signed   By: Norva Pavlov M.D.   On: 04/06/2023 09:38   [place] LIMITED ULTRASOUND INCLUDING AXILLA LEFT BREAST   Result Date: 04/06/2023 CLINICAL DATA:  Patient returns  after screening for evaluation of possible LEFT breast mass. EXAM: DIGITAL DIAGNOSTIC UNILATERAL LEFT MAMMOGRAM WITH TOMOSYNTHESIS AND CAD; ULTRASOUND LEFT BREAST LIMITED TECHNIQUE: Left digital diagnostic mammography and breast tomosynthesis was performed. The images were evaluated with computer-aided detection. ; Targeted ultrasound examination of the left breast was performed. COMPARISON:  Previous exam(s). ACR Breast Density Category c: The breasts are heterogeneously dense, which may obscure small masses. FINDINGS: Additional 2-D and 3-D images are performed. These views confirm presence of a spiculated mass in the UPPER-OUTER QUADRANT of the LEFT breast. There are associated pleomorphic calcifications. On physical exam, I palpate soft focal thickening in the 1 o'clock location of the LEFT breast. Targeted ultrasound is performed, showing an irregular hypoechoic mass with spiculated margins and internal blood flow in the 1 o'clock location of the LEFT breast 7 centimeters from the nipple. Mass is 1.4 x 0.9 x 1.5 centimeters. In adjacent similar appearing mass is 0.7 x 1.1 x 0.7 centimeters. This mass also has internal blood flow. Calcifications are identified within both lesions. Measured together, the masses span 2.6 centimeters. Evaluation of the LEFT axilla is negative for adenopathy. IMPRESSION: Adjacent similar masses in the 1 o'clock location of the LEFT breast spanning 2.6 centimeters. Given the close proximity, these lesions can be biopsied as a single lesion. No LEFT axillary adenopathy. RECOMMENDATION: Ultrasound-guided core biopsy of LEFT breast mass. I have discussed the findings and recommendations with the patient. If applicable, a reminder letter will be sent to the patient regarding the next appointment. BI-RADS CATEGORY  5: Highly suggestive of malignancy. Electronically Signed   By: Norva Pavlov M.D.   On: 04/06/2023 09:38       IMPRESSION/PLAN: 1. Stage IA, [id number], grade 1, ER/PR  positive invasive ductal carcinoma of the left breast. Dr. Mitzi Hansen discusses the pathology findings and reviews the nature of early stage left breast disease. The consensus from the breast conference includes breast conservation with lumpectomy with sentinel node biopsy. Dr. Pamelia Hoit recommends Oncotype Dx score to determine a role for systemic therapy. Provided that chemotherapy is not indicated, the patient's course would then be followed by external radiotherapy to the breast  to reduce risks of local recurrence followed by antiestrogen therapy. We discussed the risks, benefits, short, and long term effects of radiotherapy, as well as  the curative intent, and the patient is interested in proceeding. Dr. Mitzi Hansen discusses the delivery and logistics of radiotherapy and anticipates a course of 4 or up to 6 1/2 weeks of radiotherapy. We will see her back a few weeks after surgery to discuss the simulation process and anticipate we starting radiotherapy about 4-6 weeks after surgery.  2. Possible genetic predisposition to malignancy. The patient is a candidate for genetic testing given her personal history. She will meet with our geneticist today in clinic.   In a visit lasting 60 minutes, greater than 50% of the time was spent face to face reviewing her case, as well as in preparation of, discussing, and coordinating the patient's care.  The above documentation reflects my direct findings during this shared patient visit. Please see the separate note by Dr. Mitzi Hansen on this date for the remainder of the patient's plan of care.    Osker Mason, [facility]    **Disclaimer: This note was dictated with voice recognition software. Similar sounding words can inadvertently be transcribed and this note may contain transcription errors which may not have been corrected upon publication of note.**

## 2023-04-20 NOTE — Therapy (Signed)
OUTPATIENT PHYSICAL THERAPY BREAST CANCER BASELINE EVALUATION   Patient Name: BLIMY NAPOLEON MRN: [id number] DOB:01-14-1971, 52 y.o., female Today's Date: 04/20/2023  END OF SESSION:  PT End of Session - 04/20/23 1359     Visit Number 1    Number of Visits 2    Date for PT Re-Evaluation 06/15/23    PT Start Time 1341    PT Stop Time 1349   Also saw pt from 1417 to 1432 for a total of 23 minutes   PT Time Calculation (min) 8 min    Activity Tolerance Patient tolerated treatment well    Behavior During Therapy [facility] for tasks assessed/performed             Past Medical History:  Diagnosis Date   Abnormal Pap smear of cervix    Cervical disc disease    SVT (supraventricular tachycardia) (HCC)    Past Surgical History:  Procedure Laterality Date   BREAST BIOPSY Left 04/08/2023   [place] LT BREAST BX W LOC DEV 1ST LESION IMG BX SPEC US GUIDE 04/08/2023 [facility]AMMOGRAP[facility]   CESAREAN SECTION  1991   NECK SURGERY  9/14   [facility]   Patient Active Problem List   Diagnosis Date Noted   Malignant neoplasm of upper-outer quadrant of left breast in female, estrogen receptor positive (HCC) 04/18/2023   HSV (herpes simplex virus) anogenital infection 09/01/2014   Rectal fissure 09/01/2014   Cervical spinal stenosis 03/01/2013    REFERRING PROVIDER: Dr. Emelia Loron  REFERRING DIAG: Left breast cancer  THERAPY DIAG:  Malignant neoplasm of upper-outer quadrant of left breast in female, estrogen receptor positive (HCC)  Abnormal posture  Rationale for Evaluation and Treatment: Rehabilitation  ONSET DATE: 03/09/2023  SUBJECTIVE:                                                                                                                                                                                           SUBJECTIVE STATEMENT: Patient reports she is here today to be seen by her medical team for her newly diagnosed left breast cancer.   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 It measures 1.5 and 1.1 cm and is located in the upper outer quadrant. It is ER/PR positive and HER2 negative with a Ki67 of 5%.   PATIENT GOALS:   reduce lymphedema risk and learn post op HEP.   PAIN:  Are you having pain? Yes: NPRS scale: varies /10 Pain location: right arm Aggravating factors: lifting Relieving factors: unknown  PRECAUTIONS: Active CA   RED FLAGS: None   HAND  DOMINANCE: right  WEIGHT BEARING RESTRICTIONS: No  FALLS:  Has patient fallen in last 6 months? No  LIVING ENVIRONMENT: Patient lives with: her husband Lives in: House/apartment Has following equipment at home: None  OCCUPATION: Firefighter; works from home  LEISURE: She does not exercise  PRIOR LEVEL OF FUNCTION: Independent   OBJECTIVE: Note: Objective measures were completed at Evaluation unless otherwise noted.  COGNITION: Overall cognitive status: Within functional limits for tasks assessed    POSTURE:  Forward head and rounded shoulders posture  UPPER EXTREMITY AROM/PROM:  A/PROM RIGHT   eval   Shoulder extension 60  Shoulder flexion 163  Shoulder abduction 171  Shoulder internal rotation 76  Shoulder external rotation 83    (Blank rows = not tested)  A/PROM LEFT   eval  Shoulder extension 59  Shoulder flexion 143  Shoulder abduction 164  Shoulder internal rotation 77  Shoulder external rotation 88    (Blank rows = not tested)  CERVICAL AROM: All within normal limits  UPPER EXTREMITY STRENGTH: WNL  LYMPHEDEMA ASSESSMENTS (in cm):   LANDMARK RIGHT   eval  10 cm proximal to olecranon process 27.3  Olecranon process 23.5  10 cm proximal to ulnar styloid process 22  Just proximal to ulnar styloid process 14.9  Across hand at thumb web space 17.7  At base of 2nd digit 6.2  (Blank rows = not tested)  LANDMARK LEFT   eval  10 cm proximal to olecranon process 26.5  Olecranon process  22.9  10 cm proximal to ulnar styloid process 20.8  Just proximal to ulnar styloid process 14.9  Across hand at thumb web space 18.4  At base of 2nd digit 6.1  (Blank rows = not tested)  L-DEX LYMPHEDEMA SCREENING:  The patient was assessed using the L-Dex machine today to produce a lymphedema index baseline score. The patient will be reassessed on a regular basis (typically every 3 months) to obtain new L-Dex scores. If the score is > 6.5 points away from his/her baseline score indicating onset of subclinical lymphedema, it will be recommended to wear a compression garment for 4 weeks, 12 hours per day and then be reassessed. If the score continues to be > 6.5 points from baseline at reassessment, we will initiate lymphedema treatment. Assessing in this manner has a 95% rate of preventing clinically significant lymphedema.   L-DEX FLOWSHEETS - 04/20/23 1400       L-DEX LYMPHEDEMA SCREENING   Measurement Type Unilateral    L-DEX MEASUREMENT EXTREMITY Upper Extremity    POSITION  Standing    DOMINANT SIDE Right    At Risk Side Left    BASELINE SCORE (UNILATERAL) 0.4             QUICK DASH SURVEY:  Neldon Mc - 04/20/23 0001     Open a tight or new jar Severe difficulty    Do heavy household chores (wash walls, wash floors) Mild difficulty    Carry a shopping bag or briefcase No difficulty    Wash your back No difficulty    Use a knife to cut food No difficulty    Recreational activities in which you take some force or impact through your arm, shoulder, or hand (golf, hammering, tennis) Moderate difficulty    During the past week, to what extent has your arm, shoulder or hand problem interfered with your normal social activities with family, friends, neighbors, or groups? Slightly    During the past week, to what extent has your  arm, shoulder or hand problem limited your work or other regular daily activities Not at all    Arm, shoulder, or hand pain. Mild    Tingling (pins and  needles) in your arm, shoulder, or hand None    Difficulty Sleeping No difficulty    DASH Score 18.18 %              PATIENT EDUCATION:  Education details: Lymphedema risk reduction and post op shoulder/posture HEP Person educated: Patient Education method: Explanation, Demonstration, Handout Education comprehension: Patient verbalized understanding and returned demonstration  HOME EXERCISE PROGRAM: Patient was instructed today in a home exercise program today for post op shoulder range of motion. These included active assist shoulder flexion in sitting, scapular retraction, wall walking with shoulder abduction, and hands behind head external rotation.  She was encouraged to do these twice a day, holding 3 seconds and repeating 5 times when permitted by her physician.   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 It measures 1.5 and 1.1 cm and is located in the upper outer quadrant. It is ER/PR positive and HER2 negative with a Ki67 of 5%. Her multidisciplinary medical team met prior to her assessments to determine a recommended treatment plan. She is planning to have a left lumpectomy and sentinel node biopsy followed by oncotype test, radiation, and anti-estrogen therapy. She will benefit from a post op PT reassessment to determine needs and from L-Dex screens every 3 months for 2 years to detect subclinical lymphedema.  Pt will benefit from skilled therapeutic intervention to improve on the following deficits: Decreased knowledge of precautions, impaired UE functional use, pain, decreased ROM, postural dysfunction.   PT treatment/interventions: ADL/self-care home management, pt/family education, therapeutic exercise  REHAB POTENTIAL: Excellent  CLINICAL DECISION MAKING: Stable/uncomplicated  EVALUATION COMPLEXITY: Low   GOALS: Goals reviewed with patient? YES  LONG TERM GOALS: (STG=LTG)     Name Target Date Goal status  1 Pt will be able to verbalize understanding of pertinent lymphedema risk reduction practices relevant to her dx specifically related to skin care.  Baseline:  No knowledge 04/20/2023 Achieved at eval  2 Pt will be able to return demo and/or verbalize understanding of the post op HEP related to regaining shoulder ROM. Baseline:  No knowledge 04/20/2023 Achieved at eval  3 Pt will be able to verbalize understanding of the importance of attending the post op After Breast CA Class for further lymphedema risk reduction education and therapeutic exercise.  Baseline:  No knowledge 04/20/2023 Achieved at eval  4 Pt will demo she has regained full shoulder ROM and function post operatively compared to baselines.  Baseline: See objective measurements taken today. 06/15/2023     PLAN:  PT FREQUENCY/DURATION: EVAL and 1 follow up appointment.   PLAN FOR NEXT SESSION: will reassess 3-4 weeks post op to determine needs.   Patient will follow up at outpatient cancer rehab 3-4 weeks following surgery.  If the patient requires physical therapy at that time, a specific plan will be dictated and sent to the referring physician for approval. The patient was educated today on appropriate basic range of motion exercises to begin post operatively and the importance of attending the After Breast Cancer class following surgery.  Patient was educated today on lymphedema risk reduction practices as it pertains to recommendations that will benefit the patient immediately following surgery.  She verbalized good understanding.    Physical Therapy Information for After Breast  Cancer Surgery/Treatment:  Lymphedema is a swelling condition that you may be at risk for in your arm if you have lymph nodes removed from the armpit area.  After a sentinel node biopsy, the risk is approximately 5-9% and is higher after an axillary node dissection.  There is treatment available for this condition and it is not  life-threatening.  Contact your physician or physical therapist with concerns. You may begin the 4 shoulder/posture exercises (see additional sheet) when permitted by your physician (typically a week after surgery).  If you have drains, you may need to wait until those are removed before beginning range of motion exercises.  A general recommendation is to not lift your arms above shoulder height until drains are removed.  These exercises should be done to your tolerance and gently.  This is not a "no pain/no gain" type of recovery so listen to your body and stretch into the range of motion that you can tolerate, stopping if you have pain.  If you are having immediate reconstruction, ask your plastic surgeon about doing exercises as he or she may want you to wait. We encourage you to attend the free one time ABC (After Breast Cancer) class offered by [facility].  You will learn information related to lymphedema risk, prevention and treatment and additional exercises to regain mobility following surgery.  You can call [phone] for more information.  This is offered the 1st and 3rd Monday of each month.  You only attend the class one time. While undergoing any medical procedure or treatment, try to avoid blood pressure being taken or needle sticks from occurring on the arm on the side of cancer.   This recommendation begins after surgery and continues for the rest of your life.  This may help reduce your risk of getting lymphedema (swelling in your arm). An excellent resource for those seeking information on lymphedema is the National Lymphedema Network's web site. It can be accessed at www.lymphnet.org If you notice swelling in your hand, arm or breast at any time following surgery (even if it is many years from now), please contact your doctor or physical therapist to discuss this.  Lymphedema can be treated at any time but it is easier for you if it is treated early on.  If you feel  like your shoulder motion is not returning to normal in a reasonable amount of time, please contact your surgeon or physical therapist.  [facility] [phone]. [address], [address], [place] [address]  ABC CLASS After Breast Cancer Class  After Breast Cancer Class is a specially designed exercise class to assist you in a safe recover after having breast cancer surgery.  In this class you will learn how to get back to full function whether your drains were just removed or if you had surgery a month ago.  This one-time class is held the 1st and 3rd Monday of every month from 11:00 a.m. until 12:00 noon virtually.  This class is FREE and space is limited. For more information or to register for the next available class, call [phone].  Class Goals  Understand specific stretches to improve the flexibility of you chest and shoulder. Learn ways to safely strengthen your upper body and improve your posture. Understand the warning signs of infection and why you may be at risk for an arm infection. Learn about Lymphedema and prevention.  ** You do not attend this class until after surgery.  Drains must be removed to participate  Patient was instructed today in a home exercise program today for post op shoulder range of motion. These included active assist shoulder flexion in sitting, scapular retraction, wall walking with shoulder abduction, and hands behind head external rotation.  She was encouraged to do these twice a day, holding 3 seconds and repeating 5 times when permitted by her physician.  Bethann Punches, [place] 04/20/23 2:41 PM

## 2023-04-20 NOTE — Progress Notes (Signed)
REFERRING PROVIDER: Serena Croissant, MD [address] [place],  [place] [address]  PRIMARY PROVIDER:  Soundra Pilon, FNP  PRIMARY REASON FOR VISIT:  Encounter Diagnoses  Name Primary?   Malignant neoplasm of upper-outer quadrant of left breast in female, estrogen receptor positive (HCC) Yes   Family history of breast cancer    Family history of colon cancer     HISTORY OF PRESENT ILLNESS:   Heidi Nolan, a 52 y.o. female, was seen for a [place] cancer genetics consultation during the breast multidisciplinary clinic at the request of Dr. Pamelia Hoit due to a personal history of breast cancer.  Heidi Nolan presents to clinic today to discuss the possibility of a hereditary predisposition to cancer, to discuss genetic testing, and to further clarify her future cancer risks, as well as potential cancer risks for family members.   In October 2024, at the age of 75, Heidi Nolan was diagnosed with invasive ductal carcinoma of the left breast (ER+/PR+/HER2-). The treatment plan is pending.   CANCER HISTORY:  Oncology History  Malignant neoplasm of upper-outer quadrant of left breast in female, estrogen receptor positive (HCC)  04/08/2023 Initial Diagnosis   Screening mammogram detected left breast 2 masses total measuring 2.6 cm (1.5 cm and 1.1 cm) biopsy revealed grade 1 IDC ER 100% PR 100% HER2 negative, Ki-67 5%   04/20/2023 Cancer Staging   Staging form: Breast, AJCC 8th Edition - Clinical stage from 04/20/2023: Stage IA (cT1c, cN0, cM0, G1, ER+, PR+, HER2-) - Signed by Ronny Bacon, PA-C on 04/20/2023 Stage prefix: Initial diagnosis Histologic grading system: 3 grade system      Past Medical History:  Diagnosis Date   Abnormal Pap smear of cervix    Cervical disc disease    SVT (supraventricular tachycardia) (HCC)     Past Surgical History:  Procedure Laterality Date   BREAST BIOPSY Left 04/08/2023   [place] LT BREAST BX W LOC DEV 1ST LESION IMG BX SPEC US GUIDE  04/08/2023 [facility] MAMMOGRAPHY   CESAREAN SECTION  1991   NECK SURGERY  9/14   [facility]     FAMILY HISTORY:  We obtained a detailed, 4-generation family history.  Significant diagnoses are listed below: Family History  Problem Relation Age of Onset   Colon cancer Maternal Aunt        dx >50   Colon cancer Maternal Aunt        dx <50   Throat cancer Paternal Uncle        dx >50; or lung?   Breast cancer Maternal Grandmother        dx 11s   Lung cancer Paternal Grandfather        dx >50     Heidi Nolan is unaware of previous family history of genetic testing for hereditary cancer risks. There is no reported Ashkenazi Jewish ancestry. There is no known consanguinity.  GENETIC COUNSELING ASSESSMENT: Heidi Nolan is a 52 y.o. female with a personal and family history of cancer which is somewhat suggestive of a hereditary cancer syndrome and predisposition to cancer given her age of diagnosis and the presence of related cancers in the family. We, therefore, discussed and recommended the following at today's visit.   DISCUSSION: We discussed that 5 - 10% of cancer is hereditary, with most cases of hereditary breast cancer associated with mutations in BRCA1/2.  There are other genes that can be associated with hereditary breast, colon, or other cancer syndromes.  Type of cancer risk and  level of risk are gene-specific. We discussed that testing is beneficial for several reasons including knowing how to follow individuals after completing their treatment, identifying whether potential treatment options would be beneficial, and understanding if other family members could be at risk for cancer and allowing them to undergo genetic testing.   We reviewed the characteristics, features and inheritance patterns of hereditary cancer syndromes. We also discussed genetic testing, including the appropriate family members to test, the process of testing, insurance coverage and turn-around-time for results. We  discussed the implications of a negative, positive and/or variant of uncertain significant result. In order to get genetic test results in a timely manner so that Heidi Nolan can use these genetic test results for surgical decisions, we recommended Heidi Nolan pursue genetic testing for the [employer]  The BRCAPlus gene panel offered by [facility] and includes sequencing and rearrangement analysis for the following 13 genes: ATM, BARD1, BRCA1, BRCA2, CDH1, CHEK2, NF1, PALB2, PTEN, RAD51C, RAD51D, STK11 and TP53.  Once complete, we recommend Heidi Nolan pursue reflex genetic testing to a more comprehensive gene panel.   The [facility] CancerNext+RNAinsight Panel includes sequencing, rearrangement analysis, and RNA analysis for the following 34 genes: APC, ATM, BARD1, BMPR1A, BRCA1, BRCA2, BRIP1, CDH1, CDK4, CDKN2A, CHEK2, DICER1, MLH1, MSH2, MSH6, MUTYH, NF1, NTHL1, PALB2, PMS2, PTEN, RAD51C, RAD51D, SMAD4, SMARCA4, STK11 and TP53 (sequencing and deletion/duplication); AXIN2, HOXB13, MSH3, POLD1 and POLE (sequencing only); EPCAM and GREM1 (deletion/duplication only).  Based on Heidi Nolan's personal history of breast cancer, she meets medical criteria for genetic testing through the [employer].  She does not meet NCCN criteria for genetic testing; however, we still recommended testing given her age of diagnosis (turned 8 only ~1 month before diagnosis), the presence of breast cancer in her maternal grandmother, and the presence of young colorectal cancer in her maternal family.  Results of genetic testing may impact medical management, including but not limited to consideration for risk reducing mastectomy or high risk breast screening.  We discussed that she may still have an out of pocket cost. We discussed that if her out of pocket cost for testing is over $100, the laboratory should contact them to discuss self-pay prices, patient pay assistance programs, if applicable,  and other billing options.   PLAN: After considering the risks, benefits, and limitations, Heidi Nolan provided informed consent to pursue genetic testing and the blood sample was sent to [facility] for analysis of the BRCAPlus and CancerNext +RNA Panel. Results should be available within approximately 1-2 weeks' time, at which point they will be disclosed by telephone to Heidi Nolan, as will any additional recommendations warranted by these results. Heidi Nolan will receive a summary of her genetic counseling visit and a copy of her results once available. This information will also be available in Epic.    Heidi Nolan questions were answered to her satisfaction today. Our contact information was provided should additional questions or concerns arise. Thank you for the referral and allowing [place] to share in the care of your patient.   Sary Bogie M. Rennie Plowman, MS, [facility] Genetic Counselor [email or web address] (P) [phone]  The patient was seen for a total of 16 minutes in face-to-face genetic counseling.  The patient was accompanied by her husband.  Dr. Pamelia Hoit was available to discuss this case as needed. _______________________________________________________________________ For Office Staff:  Number of people involved in session: 2 Was an Intern/ student involved with case: no

## 2023-04-20 NOTE — Progress Notes (Signed)
[facility] Multidisciplinary Clinic Spiritual Care Note  Met with Heidi Nolan in Breast Multidisciplinary Clinic to introduce Support Center team/resources.  she completed SDOH screening; results follow below.    SDOH Screenings   Food Insecurity: No Food Insecurity (04/20/2023)  Housing: Low Risk  (04/20/2023)  Transportation Needs: No Transportation Needs (04/20/2023)  Depression (PHQ2-9): Low Risk  (04/20/2023)  Tobacco Use: High Risk (04/20/2023)     Chaplain and patient discussed common feelings and emotions when being diagnosed with cancer, and the importance of support during treatment.  Chaplain informed patient of the support team and support services at [facility], which are of interest to her.  Chaplain provided contact information and encouraged patient to call with any questions or concerns.  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 She has a self-care plan in place for tonight and expects that she may sleep better, now that her distress is reduced after meeting the team and learning the scope of her diagnosis and treatment.  Follow up needed: No. [name] plans to phone as needed/desired.   [address] [place], [place], [facility] Pager [phone] Voicemail [phone]

## 2023-04-20 NOTE — Assessment & Plan Note (Signed)
04/08/2023:Screening mammogram detected left breast 2 masses total measuring 2.6 cm (1.5 cm and 1.1 cm) biopsy revealed grade 1 IDC ER 100% PR 100% HER2 negative, Ki-67 5%  Pathology and radiology counseling:Discussed with the patient, the details of pathology including the type of breast cancer,the clinical staging, the significance of ER, PR and HER-2/neu receptors and the implications for treatment. After reviewing the pathology in detail, we proceeded to discuss the different treatment options between surgery, radiation, chemotherapy, antiestrogen therapies.  Recommendations: 1. Breast conserving surgery followed by 2. Oncotype DX testing to determine if chemotherapy would be of any benefit followed by 3. Adjuvant radiation therapy followed by 4. Adjuvant antiestrogen therapy  Oncotype counseling: I discussed Oncotype DX test. I explained to the patient that this is a 21 gene panel to evaluate patient tumors DNA to calculate recurrence score. This would help determine whether patient has high risk or low risk breast cancer. She understands that if her tumor was found to be high risk, she would benefit from systemic chemotherapy. If low risk, no need of chemotherapy.  Return to clinic after surgery to discuss final pathology report and then determine if Oncotype DX testing will need to be sent.

## 2023-04-20 NOTE — Progress Notes (Signed)
[facility] CONSULT NOTE  Patient Care Team: Soundra Pilon, FNP as PCP - General (Family Medicine) Donnelly Angelica, RN as Oncology Nurse Navigator Pershing Proud, RN as Oncology Nurse Navigator Emelia Loron, MD as Consulting Physician (General Surgery) Serena Croissant, MD as Consulting Physician (Hematology and Oncology) Dorothy Puffer, MD as Consulting Physician (Radiation Oncology)  CHIEF COMPLAINTS/PURPOSE OF CONSULTATION:  Newly diagnosed breast cancer  HISTORY OF PRESENTING ILLNESS: Ms. Gladhill is a 52 year old with above-mentioned history of newly diagnosed left breast cancer.  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  She was presented this morning to the multidisciplinary tumor board and she is here today accompanied by her husband to discuss her treatment plan.  I reviewed her records extensively and collaborated the history with the patient.  SUMMARY OF ONCOLOGIC HISTORY: Oncology History  Malignant neoplasm of upper-outer quadrant of left breast in female, estrogen receptor positive (HCC)  04/08/2023 Initial Diagnosis   Screening mammogram detected left breast 2 masses total measuring 2.6 cm (1.5 cm and 1.1 cm) biopsy revealed grade 1 IDC ER 100% PR 100% HER2 negative, Ki-67 5%   04/20/2023 Cancer Staging   Staging form: Breast, AJCC 8th Edition - Clinical stage from 04/20/2023: Stage IA (cT1c, cN0, cM0, G1, ER+, PR+, HER2-) - Signed by Ronny Bacon, PA-C on 04/20/2023 Stage prefix: Initial diagnosis Histologic grading system: 3 grade system      MEDICAL HISTORY:  Past Medical History:  Diagnosis Date   Abnormal Pap smear of cervix    Cervical disc disease    SVT (supraventricular tachycardia) (HCC)     SURGICAL HISTORY: Past Surgical  History:  Procedure Laterality Date   BREAST BIOPSY Left 04/08/2023   [place] LT BREAST BX W LOC DEV 1ST LESION IMG BX SPEC US GUIDE 04/08/2023 [facility] MAMMOGRAPHY   CESAREAN SECTION  1991   NECK SURGERY  9/14   [facility]    SOCIAL HISTORY: Social History   Socioeconomic History   Marital status: Married    Spouse name: Not on file   Number of children: Not on file   Years of education: Not on file   Highest education level: Not on file  Occupational History   Not on file  Tobacco Use   Smoking status: Heavy Smoker    Current packs/day: 0.50    Average packs/day: 0.5 packs/day for 30.0 years (15.0 ttl pk-yrs)    Types: Cigarettes   Smokeless tobacco: Never   Tobacco comments:    2 cigarettes a day  Vaping Use   Vaping status: Never Used  Substance and Sexual Activity   Alcohol use: Yes    Alcohol/week: 5.0 - 7.0 standard drinks of alcohol    Types: 5 - 7 Standard drinks or equivalent per week    Comment: 1 glass of wine at night   Drug use: No   Sexual activity: Yes    Partners: Male    Birth control/protection: Pill  Other Topics Concern   Not on file  Social History Narrative   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     Social Determinants of Health   Financial Resource Strain: Not on file  Food Insecurity: Not on file  Transportation Needs: Not on file  Physical Activity: Not  on file  Stress: Not on file  Social Connections: Not on file  Intimate Partner Violence: Not on file    FAMILY HISTORY: Family History  Problem Relation Age of Onset   Osteoarthritis Mother    Heart attack Father 9       Died of MI   Breast cancer Maternal Grandmother    Cancer Paternal Grandfather    Lung cancer Paternal Grandfather     ALLERGIES:  has No Known Allergies.  MEDICATIONS:  Current Outpatient Medications  Medication Sig Dispense Refill   amphetamine-dextroamphetamine (ADDERALL) 20 MG tablet Take 20 mg by mouth 3 (three) times daily.      Cholecalciferol (VITAMIN D3) 1.25 MG (50000 UT) CAPS Take 1 capsule by mouth daily at 12 noon.     loratadine (CLARITIN) 10 MG tablet Take 10 mg by mouth daily.     montelukast (SINGULAIR) 10 MG tablet Take 10 mg by mouth daily.  3   OVER THE COUNTER MEDICATION Take 1 tablet by mouth daily at 12 noon. Digestive enzymes with Prebiotic     OVER THE COUNTER MEDICATION Take 20 mg by mouth daily at 12 noon. [employer] Acid Relief     vitamin B-12 (CYANOCOBALAMIN) 100 MCG tablet Take 100 mcg by mouth daily.     cetirizine (ZYRTEC) 10 MG tablet Take 10 mg by mouth daily. (Patient not taking: Reported on 04/20/2023)     metoprolol tartrate (LOPRESSOR) 25 MG tablet Take 1 tablet (25 mg total) by mouth once as needed for up to 1 dose. Take this tab as needed if you feel palpitations and your heart rate is elevated (Patient not taking: Reported on 04/20/2023) 15 tablet 0   VENTOLIN HFA 108 (90 BASE) MCG/ACT inhaler Inhale 2 puffs into the lungs every 4 (four) hours as needed for wheezing.  (Patient not taking: Reported on 04/20/2023)     No current facility-administered medications for this visit.    REVIEW OF SYSTEMS:   Constitutional: Denies fevers, chills or abnormal night sweats Breast:  Denies any palpable lumps or discharge All other systems were reviewed with the patient and are negative.  PHYSICAL EXAMINATION: ECOG PERFORMANCE STATUS: 0 - Asymptomatic  Vitals:   04/20/23 1224  BP: 132/85  Pulse: 71  Resp: 18  Temp: (!) 97.5 F (36.4 C)  SpO2: 100%   Filed Weights   04/20/23 1224  Weight: 139 lb 4.8 oz (63.2 kg)    GENERAL:alert, no distress and comfortable   LABORATORY DATA:  I have reviewed the data as listed Lab Results  Component Value Date   WBC 7.0 04/20/2023   HGB 13.7 04/20/2023   HCT 39.5 04/20/2023   MCV 91.6 04/20/2023   PLT 377 04/20/2023   Lab Results  Component Value Date   NA 137 04/20/2023   K 4.0 04/20/2023   CL 102 04/20/2023   CO2 31 04/20/2023     RADIOGRAPHIC STUDIES: I have personally reviewed the radiological reports and agreed with the findings in the report.  ASSESSMENT AND PLAN:  Malignant neoplasm of upper-outer quadrant of left breast in female, estrogen receptor positive (HCC) 04/08/2023:Screening mammogram detected left breast 2 masses total measuring 2.6 cm (1.5 cm and 1.1 cm) biopsy revealed grade 1 IDC ER 100% PR 100% HER2 negative, Ki-67 5%  Pathology and radiology counseling:Discussed with the patient, the details of pathology including the type of breast cancer,the clinical staging, the significance of ER, PR and HER-2/neu receptors and the implications for treatment. After reviewing the  pathology in detail, we proceeded to discuss the different treatment options between surgery, radiation, chemotherapy, antiestrogen therapies.  Recommendations: 1. Breast conserving surgery followed by 2. Oncotype DX testing to determine if chemotherapy would be of any benefit followed by 3. Adjuvant radiation therapy followed by 4. Adjuvant antiestrogen therapy  Oncotype counseling: I discussed Oncotype DX test. I explained to the patient that this is a 21 gene panel to evaluate patient tumors DNA to calculate recurrence score. This would help determine whether patient has high risk or low risk breast cancer. She understands that if her tumor was found to be high risk, she would benefit from systemic chemotherapy. If low risk, no need of chemotherapy.  Return to clinic after surgery to discuss final pathology report and then determine if Oncotype DX testing will need to be sent.  All questions were answered. The patient knows to call the clinic with any problems, questions or concerns.    Tamsen Meek, MD 04/20/23

## 2023-04-25 ENCOUNTER — Other Ambulatory Visit: Payer: Self-pay | Admitting: General Surgery

## 2023-04-25 ENCOUNTER — Encounter: Payer: Self-pay | Admitting: *Deleted

## 2023-04-25 ENCOUNTER — Telehealth: Payer: Self-pay | Admitting: *Deleted

## 2023-04-25 ENCOUNTER — Telehealth: Payer: Self-pay | Admitting: Hematology and Oncology

## 2023-04-25 DIAGNOSIS — C50412 Malignant neoplasm of upper-outer quadrant of left female breast: Secondary | ICD-10-CM

## 2023-04-25 NOTE — Telephone Encounter (Signed)
Spoke with patient confirming upcoming appointment  

## 2023-04-25 NOTE — Telephone Encounter (Signed)
Spoke with patient to follow up from [facility] 11/6 and assess navigation needs. Patient denies any questions or concerns at this time. Encouraged her to call should anything arise. Patient verbalized understanding.

## 2023-04-28 NOTE — Pre-Procedure Instructions (Signed)
Surgical Instructions   Your procedure is scheduled on Wednesday, November 20th. Report to [facility] Main Entrance "A" at 06:30 A.M., then check in with the Admitting office. Any questions or running late day of surgery: call [phone]  Questions prior to your surgery date: call [phone], Monday-Friday, 8am-4pm. If you experience any cold or flu symptoms such as cough, fever, chills, shortness of breath, etc. between now and your scheduled surgery, please notify us at the above number.     Remember:  Do not eat after midnight the night before your surgery   You may drink clear liquids until 05:30 AM the morning of your surgery.   Clear liquids allowed are: Water, Non-Citrus Juices (without pulp), Carbonated Beverages, Clear Tea (no milk, honey, etc.), Black Coffee Only (NO MILK, CREAM OR POWDERED CREAMER of any kind), and Gatorade.  Patient Instructions  The night before surgery:  No food after midnight. ONLY clear liquids after midnight  The day of surgery (if you do NOT have diabetes):  Drink ONE (1) Pre-Surgery Clear Ensure by 05:30 AM the morning of surgery. Drink in one sitting. Do not sip.  This drink was given to you during your hospital  pre-op appointment visit.  Nothing else to drink after completing the  Pre-Surgery Clear Ensure.         If you have questions, please contact your surgeon's office.    Take these medicines the morning of surgery with A SIP OF WATER  famotidine (PEPCID)  loratadine (CLARITIN)  montelukast (SINGULAIR)    May take these medicines IF NEEDED: metoprolol tartrate (LOPRESSOR)  VENTOLIN HFA 108 (90 BASE)- bring inhaler with you on day of surgery   One week prior to surgery, STOP taking any Aspirin (unless otherwise instructed by your surgeon) Aleve, Naproxen, Ibuprofen, Motrin, Advil, Goody's, BC's, all herbal medications, fish oil, and non-prescription vitamins.                     Do NOT Smoke (Tobacco/Vaping) for 24 hours  prior to your procedure.  If you use a CPAP at night, you may bring your mask/headgear for your overnight stay.   You will be asked to remove any contacts, glasses, piercing's, hearing aid's, dentures/partials prior to surgery. Please bring cases for these items if needed.    Patients discharged the day of surgery will not be allowed to drive home, and someone needs to stay with them for 24 hours.  SURGICAL WAITING ROOM VISITATION Patients may have no more than 2 support people in the waiting area - these visitors may rotate.   Pre-op nurse will coordinate an appropriate time for 1 ADULT support person, who may not rotate, to accompany patient in pre-op.  Children under the age of 2 must have an adult with them who is not the patient and must remain in the main waiting area with an adult.  If the patient needs to stay at the hospital during part of their recovery, the visitor guidelines for inpatient rooms apply.  Please refer to the [facility] website for the visitor guidelines for any additional information.   If you received a COVID test during your pre-op visit  it is requested that you wear a mask when out in public, stay away from anyone that may not be feeling well and notify your surgeon if you develop symptoms. If you have been in contact with anyone that has tested positive in the last 10 days please notify you surgeon.  Pre-operative CHG Bathing Instructions   You can play a key role in reducing the risk of infection after surgery. Your skin needs to be as free of germs as possible. You can reduce the number of germs on your skin by washing with CHG (chlorhexidine gluconate) soap before surgery. CHG is an antiseptic soap that kills germs and continues to kill germs even after washing.   DO NOT use if you have an allergy to chlorhexidine/CHG or antibacterial soaps. If your skin becomes reddened or irritated, stop using the CHG and notify one of our RNs at [phone].               TAKE A SHOWER THE NIGHT BEFORE SURGERY AND THE DAY OF SURGERY    Please keep in mind the following:  DO NOT shave, including legs and underarms, 48 hours prior to surgery.   You may shave your face before/day of surgery.  Place clean sheets on your bed the night before surgery Use a clean washcloth (not used since being washed) for each shower. DO NOT sleep with pet's night before surgery.  CHG Shower Instructions:  Wash your face and private area with normal soap. If you choose to wash your hair, wash first with your normal shampoo.  After you use shampoo/soap, rinse your hair and body thoroughly to remove shampoo/soap residue.  Turn the water OFF and apply half the bottle of CHG soap to a CLEAN washcloth.  Apply CHG soap ONLY FROM YOUR NECK DOWN TO YOUR TOES (washing for 3-5 minutes)  DO NOT use CHG soap on face, private areas, open wounds, or sores.  Pay special attention to the area where your surgery is being performed.  If you are having back surgery, having someone wash your back for you may be helpful. Wait 2 minutes after CHG soap is applied, then you may rinse off the CHG soap.  Pat dry with a clean towel  Put on clean pajamas    Additional instructions for the day of surgery: DO NOT APPLY any lotions, deodorants, cologne, or perfumes.   Do not wear jewelry or makeup Do not wear nail polish, gel polish, artificial nails, or any other type of covering on natural nails (fingers and toes) Do not bring valuables to the hospital. [facility] is not responsible for valuables/personal belongings. Put on clean/comfortable clothes.  Please brush your teeth.  Ask your nurse before applying any prescription medications to the skin.

## 2023-04-29 ENCOUNTER — Encounter (HOSPITAL_COMMUNITY): Payer: Self-pay

## 2023-04-29 ENCOUNTER — Other Ambulatory Visit: Payer: Self-pay

## 2023-04-29 ENCOUNTER — Encounter (HOSPITAL_COMMUNITY)
Admission: RE | Admit: 2023-04-29 | Discharge: 2023-04-29 | Disposition: A | Discharge status: Home / Self Care | Payer: Managed Care, Other (non HMO) | Source: Ambulatory Visit | Attending: General Surgery | Admitting: General Surgery

## 2023-04-29 VITALS — BP 128/75 | HR 70 | Temp 98.1°F | Resp 18 | Ht 65.0 in | Wt 142.3 lb

## 2023-04-29 DIAGNOSIS — R590 Localized enlarged lymph nodes: Secondary | ICD-10-CM | POA: Diagnosis not present

## 2023-04-29 DIAGNOSIS — F909 Attention-deficit hyperactivity disorder, unspecified type: Secondary | ICD-10-CM | POA: Insufficient documentation

## 2023-04-29 DIAGNOSIS — C50912 Malignant neoplasm of unspecified site of left female breast: Secondary | ICD-10-CM | POA: Diagnosis not present

## 2023-04-29 DIAGNOSIS — Z01818 Encounter for other preprocedural examination: Secondary | ICD-10-CM | POA: Diagnosis present

## 2023-04-29 DIAGNOSIS — Z01812 Encounter for preprocedural laboratory examination: Secondary | ICD-10-CM | POA: Diagnosis not present

## 2023-04-29 DIAGNOSIS — Z79899 Other long term (current) drug therapy: Secondary | ICD-10-CM | POA: Diagnosis not present

## 2023-04-29 DIAGNOSIS — I471 Supraventricular tachycardia, unspecified: Secondary | ICD-10-CM | POA: Diagnosis not present

## 2023-04-29 HISTORY — DX: Malignant neoplasm of unspecified site of left female breast: C50.912

## 2023-04-29 HISTORY — DX: Attention-deficit hyperactivity disorder, unspecified type: F90.9

## 2023-04-29 HISTORY — DX: Unspecified osteoarthritis, unspecified site: M19.90

## 2023-04-29 LAB — POCT PREGNANCY, URINE: Preg Test, Ur: NEGATIVE

## 2023-04-29 NOTE — Anesthesia Preprocedure Evaluation (Signed)
Anesthesia Evaluation  Patient identified by MRN, date of birth, ID band Patient awake    Reviewed: Allergy & Precautions, NPO status , Patient's Chart, lab work & pertinent test results  Airway Mallampati: I  TM Distance: >3 FB Neck ROM: Full    Dental  (+) Teeth Intact, Dental Advisory Given   Pulmonary Current Smoker and Patient abstained from smoking. 5-6cigg/d Albuterol rarely uses, every few months   Pulmonary exam normal breath sounds clear to auscultation       Cardiovascular hypertension (118/70 preop), Pt. on medications and Pt. on home beta blockers Normal cardiovascular exam+ dysrhythmias Supra Ventricular Tachycardia  Rhythm:Regular Rate:Normal  Echocardiogram 01/12/2021:  Left ventricle cavity is normal in size and wall thickness. Normal global  wall motion. Normal LV systolic function with EF 66%. Normal diastolic  filling pattern.  Mild (Grade I) mitral regurgitation.  No evidence of pulmonary hypertension.       Neuro/Psych negative neurological ROS  negative psych ROS   GI/Hepatic negative GI ROS, Neg liver ROS,,,  Endo/Other  negative endocrine ROS    Renal/GU negative Renal ROS  negative genitourinary   Musculoskeletal  (+) Arthritis , Osteoarthritis,  Left breast Ca   Abdominal   Peds  Hematology Lab Results      Component                Value               Date                      WBC                      7.0                 04/20/2023                HGB                      13.7                04/20/2023                HCT                      39.5                04/20/2023                MCV                      91.6                04/20/2023                PLT                      377                 04/20/2023             Lab Results      Component                Value               Date  NA                       137                 04/20/2023                K                         4.0                 04/20/2023                CO2                      31                  04/20/2023                GLUCOSE                  95                  04/20/2023                BUN                      21 (H)              04/20/2023                CREATININE               0.54                04/20/2023                CALCIUM                  9.4                 04/20/2023                EGFR                     112                 01/23/2021                GFRNONAA                 >60                 04/20/2023              Anesthesia Other Findings   Reproductive/Obstetrics                             Anesthesia Physical Anesthesia Plan  ASA: 2  Anesthesia Plan: General   Post-op Pain Management: Celebrex PO (pre-op)*, Tylenol PO (pre-op)* and Regional block*   Induction: Intravenous  PONV Risk Score and Plan: 2 and Ondansetron, Dexamethasone, Midazolam and Treatment may vary due to age or medical condition  Airway Management Planned: Mask and LMA  Additional Equipment: None  Intra-op Plan:   Post-operative Plan: Extubation in OR  Informed Consent: I have reviewed the patients History and Physical, chart, labs and discussed the procedure including the risks,  benefits and alternatives for the proposed anesthesia with the patient or authorized representative who has indicated his/her understanding and acceptance.     Dental advisory given  Plan Discussed with: CRNA  Anesthesia Plan Comments: (PAT note written 04/29/2023 by [name], PA-C.  )       Anesthesia Quick Evaluation

## 2023-04-29 NOTE — Progress Notes (Signed)
Anesthesia Chart Review:  Case: [id number] Date/Time: 05/04/23 0815   Procedures:      LEFT BREAST SEED BRACKETED LUMPECTOMY (Left)     LEFT AXILLARY SENTINEL LYMPH NODE BIOPSY (Left)   Anesthesia type: General   Pre-op diagnosis: LEFT BREAST CANCER   [facility] ROOM [facility] / [facility] OR   Surgeons: Heidi Loron, MD       DISCUSSION: Patient is a 52 year old female scheduled for the above procedure.  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  She has known SVT history, about 10 episodes over 5-6 years. She is on Adderall for ADHD. Most episodes will resolve with her vagal maneuvers +/- metoprolol. She has had to go to the ED when symptoms persistent on a few occasions, including in 2022, and last was on on 01/27/23 and received adenosine. CAC was 0 on 02/09/21. 01/12/21 echo showed LVEF 66%, mild MR. Per her last office visit with cardiologist Dr. Antoine Nolan on 04/13/23, "The patient's had recurrent SVT and would be interested in ablation.  However, she wants to find out what is going on with her breast cancer first.  Told her to call me when she is ready to have this appointment scheduled and I would refer her to EP.  At this time we talked again about vagal maneuvers.  We talked about as needed use of the beta-blocker.  She is not noticing any triggers but will look for these."   She had a CBC and CMP at [facility] on 04/20/2023.  Urine pregnancy test negative on 04/29/23. She is for RSL on 04/23/23 at 10:30 AM.  Anesthesia team to evaluate on the day of surgery.   VS: BP 128/75   Pulse 70   Temp 36.7 C (Oral)   Resp 18   Ht 5\' 5"  (1.651 m)   Wt 64.5 kg   LMP 04/03/2023 (Exact Date)   SpO2 100%   BMI 23.68 kg/m    PROVIDERS: Heidi Pilon, FNP is PCP  Heidi Rotunda, MD is cardiologist Heidi Croissant, MD is Heidi Jim, MD is RAD-ONC   LABS: Most recent lab results in [facility] include: Lab Results   Component Value Date   WBC 7.0 04/20/2023   HGB 13.7 04/20/2023   HCT 39.5 04/20/2023   PLT 377 04/20/2023   GLUCOSE 95 04/20/2023   CHOL 194 01/23/2021   TRIG 89 01/23/2021   HDL 97 01/23/2021   LDLDIRECT 86 01/23/2021   LDLCALC 81 01/23/2021   ALT 19 04/20/2023   AST 17 04/20/2023   NA 137 04/20/2023   K 4.0 04/20/2023   CL 102 04/20/2023   CREATININE 0.54 04/20/2023   BUN 21 (H) 04/20/2023   CO2 31 04/20/2023     IMAGES: CXR 01/27/23:  FINDINGS: Hyperinflation. No consolidation, pneumothorax or effusion. No edema. Normal cardiopericardial silhouette. Overlapping cardiac leads. Curvature of the spine. Fixation hardware along the lower cervical spine. IMPRESSION: Hyperinflation.  No acute cardiopulmonary disease.    EKG: 04/13/23: Normal sinus rhythm When compared with ECG of 27-Jan-2023 12:47, No significant change since last tracing Confirmed by Heidi Nolan ([id number]) on 04/13/2023 2:03:27 PM   CV: CT Cardiac Calcium Scoring 02/09/21: IMPRESSION: 1. Patient's total coronary artery calcium score is 0 which indicates a very low (but nonzero) risk of major adverse cardiovascular events over the next 10 years. 2. No significant incidental noncardiac findings are noted.  Echocardiogram 01/12/2021:  Left ventricle cavity is normal in  size and wall thickness. Normal global  wall motion. Normal LV systolic function with EF 66%. Normal diastolic  filling pattern.  Mild (Grade I) mitral regurgitation.  No evidence of pulmonary hypertension.    Past Medical History:  Diagnosis Date   Abnormal Pap smear of cervix    ADHD (attention deficit hyperactivity disorder)    Arthritis    hips and back   Cancer of left breast (HCC)    Cervical disc disease    SVT (supraventricular tachycardia) (HCC)     Past Surgical History:  Procedure Laterality Date   BREAST BIOPSY Left 04/08/2023   [place] LT BREAST BX W LOC DEV 1ST LESION IMG BX SPEC US GUIDE 04/08/2023 [facility]  MAMMOGRAPHY   CESAREAN SECTION  1991   NECK SURGERY  9/14   [facility]    MEDICATIONS:  amphetamine-dextroamphetamine (ADDERALL) 20 MG tablet   betamethasone dipropionate 0.05 % cream   Cholecalciferol (VITAMIN D3) 50 MCG (2000 UT) TABS   Digestive Enzyme CAPS   famotidine (PEPCID) 20 MG tablet   loratadine (CLARITIN) 10 MG tablet   metoprolol tartrate (LOPRESSOR) 25 MG tablet   montelukast (SINGULAIR) 10 MG tablet   Probiotic Product (PROBIOTIC PO)   Tapinarof (VTAMA EX)   VENTOLIN HFA 108 (90 BASE) MCG/ACT inhaler   vitamin B-12 (CYANOCOBALAMIN) 500 MCG tablet   No current facility-administered medications for this encounter.    Heidi Chock, PA-C Surgical Short Stay/Anesthesiology [facility] Phone [phone] [facility] Phone [phone] 04/29/2023 5:25 PM

## 2023-04-29 NOTE — Progress Notes (Signed)
PCP - Peri Maris, FNP Cardiologist - Dr. Rollene Rotunda  PPM/ICD - denies   Chest x-ray - 01/27/23 EKG - 04/13/23 Stress Test - denies ECHO - 01/12/21 Cardiac Cath - denies  Sleep Study - denies   DM- denies  ASA/Blood Thinner Instructions:n/a   ERAS Protcol - yes PRE-SURGERY Ensure given at PAT  COVID TEST- n/a   Anesthesia review: yes, cardiac hx and breast seed placement  Patient denies shortness of breath, fever, cough and chest pain at PAT appointment   All instructions explained to the patient, with a verbal understanding of the material. Patient agrees to go over the instructions while at home for a better understanding.  The opportunity to ask questions was provided.

## 2023-05-02 ENCOUNTER — Encounter: Payer: Self-pay | Admitting: Genetic Counselor

## 2023-05-02 ENCOUNTER — Telehealth: Payer: Self-pay | Admitting: Genetic Counselor

## 2023-05-02 DIAGNOSIS — Z1379 Encounter for other screening for genetic and chromosomal anomalies: Secondary | ICD-10-CM | POA: Insufficient documentation

## 2023-05-02 NOTE — Telephone Encounter (Signed)
Disclosed negative STAT Panel.  Pan-cancer panel is pending.

## 2023-05-03 ENCOUNTER — Ambulatory Visit: Payer: Self-pay | Admitting: Genetic Counselor

## 2023-05-03 ENCOUNTER — Inpatient Hospital Stay
Admission: RE | Admit: 2023-05-03 | Discharge: 2023-05-03 | Disposition: A | Discharge status: Home / Self Care | Payer: Managed Care, Other (non HMO) | Source: Ambulatory Visit | Attending: General Surgery | Admitting: General Surgery

## 2023-05-03 ENCOUNTER — Ambulatory Visit
Admission: RE | Admit: 2023-05-03 | Discharge: 2023-05-03 | Disposition: A | Discharge status: Home / Self Care | Payer: Managed Care, Other (non HMO) | Source: Ambulatory Visit | Attending: General Surgery | Admitting: General Surgery

## 2023-05-03 DIAGNOSIS — Z800 Family history of malignant neoplasm of digestive organs: Secondary | ICD-10-CM

## 2023-05-03 DIAGNOSIS — C50412 Malignant neoplasm of upper-outer quadrant of left female breast: Secondary | ICD-10-CM

## 2023-05-03 DIAGNOSIS — Z803 Family history of malignant neoplasm of breast: Secondary | ICD-10-CM

## 2023-05-03 DIAGNOSIS — Z1379 Encounter for other screening for genetic and chromosomal anomalies: Secondary | ICD-10-CM

## 2023-05-03 HISTORY — PX: BREAST BIOPSY: SHX20

## 2023-05-03 NOTE — H&P (Signed)
16 yof who has no prior breast history and had screening mm.  This shows c density breast tissue.  There is a mass at 1 oclock that is 1.5x1.4x0.9 cm and an adjacent mass that measures 1.1 cm in size.  The whole span is 2.6 cm.  Ax [place] is negative. Biopsy is a grade I IDC that is 60% er pos, 70% pr pos, her 2 negative , Ki is 5%.  She is here with her husband to discuss options. 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 She has fh in mgm of breast cancer and lung cancer on fathers side in a nonsmoker     Review of Systems: A complete review of systems was obtained from the patient.  I have reviewed this information and discussed as appropriate with the patient.  See HPI as well for other ROS.   Review of Systems  Musculoskeletal:  Positive for back pain and joint pain.  All other systems reviewed and are negative.     Medical History: Past Medical History      Past Medical History:  Diagnosis Date   Asthma, unspecified asthma severity, unspecified whether complicated, unspecified whether persistent (HHS-HCC)     History of cancer     Tachycardia          Problem List     Patient Active Problem List  Diagnosis   Genital herpes simplex        Past Surgical History       Past Surgical History:  Procedure Laterality Date   CESAREAN SECTION N/A 1991   Anterior cervical discectomy and fusion Cervical 5-6, C6-7 with allograft and plating N/A 03/01/2013   .L breast biopsy Left 04/08/2023        Allergies  No Known Allergies     Medications Ordered Prior to Encounter        Current Outpatient Medications on File Prior to Visit  Medication Sig Dispense Refill   albuterol MDI, PROVENTIL, VENTOLIN, PROAIR, HFA 90 mcg/actuation inhaler Inhale 2 inhalations into the lungs every 4 (four) hours as needed for Wheezing       cetirizine (ZYRTEC) 10 MG chewable tablet Take 10 mg by mouth once daily       dextroamphetamine-amphetamine (ADDERALL) 20 mg tablet Take 20 mg by mouth 3 (three)  times daily            DNF 04/11/2023 TAKE 1 TABLET BY MOUTH 3 TIMES DAILY       metoprolol tartrate (LOPRESSOR) 25 MG tablet Take 25 mg by mouth as directed (TAKE 1 TABLET BY MOUTH TWICE DAILY WITH FOOD AS NEEDED FOR TACHYCARDIA)       montelukast (SINGULAIR) 10 mg tablet Take 10 mg by mouth once daily        No current facility-administered medications on file prior to visit.        Family History       Family History  Problem Relation Age of Onset   Coronary Artery Disease (Blocked arteries around heart) Father          Tobacco Use History  Social History        Tobacco Use  Smoking Status Every Day   Types: Cigarettes  Smokeless Tobacco Not on file        Social History[] Expand by Default  Social History         Socioeconomic History   Marital status: Married  Tobacco Use   Smoking status: Every Day  Types: Cigarettes  Vaping Use   Vaping status: Unknown  Substance and Sexual Activity   Alcohol use: Yes      Comment: Couple glasses of wine nightly   Drug use: Never        Objective:  There were no vitals filed for this visit.  There is no height or weight on file to calculate BMI.   Physical Exam Vitals reviewed.  Constitutional:      Appearance: Normal appearance.  Chest:  Breasts:    Right: No inverted nipple, mass or nipple discharge.     Left: No inverted nipple, mass or nipple discharge.  Lymphadenopathy:     Upper Body:     Right upper body: No supraclavicular or axillary adenopathy.     Left upper body: No supraclavicular or axillary adenopathy.  Neurological:     Mental Status: She is alert.      Assessment and Plan:    Assessment Diagnoses and all orders for this visit:   Malignant neoplasm of upper-outer quadrant of left breast in female, estrogen receptor positive (CMS/HHS-HCC)    left breast seed lumpectomy,  left ax sn biopsy   We discussed the staging and pathophysiology of breast cancer. We discussed all of the different  options for treatment for breast cancer including surgery, chemotherapy, radiation therapy, Herceptin, and antiestrogen therapy.   We discussed a sentinel lymph node biopsy as she does not appear to having lymph node involvement right now. We discussed the performance of that with injection of Magtrace. We discussed that there is a chance of having a positive node with a sentinel lymph node biopsy and we will await the permanent pathology to make any other first further decisions in terms of her treatment. We discussed up to a 5% risk lifetime of chronic shoulder pain as well as lymphedema associated with a sentinel lymph node biopsy.   We discussed the options for treatment of the breast cancer which included lumpectomy versus a mastectomy. We discussed the performance of the lumpectomy with radioactive seed placement. We discussed a 5-10% chance of a positive margin requiring reexcision in the operating room. We also discussed that she will be recommended radiation therapy if she undergoes lumpectomy.  We discussed mastectomy and the postoperative care for that as well. Mastectomy can be followed by reconstruction. The decision for lumpectomy vs mastectomy has no impact on decision for chemotherapy. Most mastectomy patients will not need radiation therapy. We discussed that there is no difference in her survival whether she undergoes lumpectomy with radiation therapy or antiestrogen therapy versus a mastectomy. There is also no real difference between her recurrence in the breast.   We discussed the risks of operation including bleeding, infection, possible reoperation. She understands her further therapy will be based on what her stages at the time of her operation.

## 2023-05-03 NOTE — Progress Notes (Signed)
HPI:   Ms. Sugalski was previously seen in the [facility] due to a personal history of breast cancer and concerns regarding a hereditary predisposition to cancer.    Ms. Zuhlke recent genetic test results were disclosed to her by telephone (STAT results) and MyChart message (pan-cancer panel results). These results and recommendations are discussed in more detail below.  CANCER HISTORY:  Oncology History  Malignant neoplasm of upper-outer quadrant of left breast in female, estrogen receptor positive (HCC)  04/08/2023 Initial Diagnosis   Screening mammogram detected left breast 2 masses total measuring 2.6 cm (1.5 cm and 1.1 cm) biopsy revealed grade 1 IDC ER 100% PR 100% HER2 negative, Ki-67 5%   04/20/2023 Cancer Staging   Staging form: Breast, AJCC 8th Edition - Clinical stage from 04/20/2023: Stage IA (cT1c, cN0, cM0, G1, ER+, PR+, HER2-) - Signed by Ronny Bacon, PA-C on 04/20/2023 Stage prefix: Initial diagnosis Histologic grading system: 3 grade system   05/02/2023 Genetic Testing   Negative [facility] CancerNext+RNAinsight Panel.  Report date is 05/02/2023.   The [facility] CancerNext+RNAinsight Panel includes sequencing, rearrangement analysis, and RNA analysis for the following 34 genes: APC, ATM, BARD1, BMPR1A, BRCA1, BRCA2, BRIP1, CDH1, CDK4, CDKN2A, CHEK2, DICER1, MLH1, MSH2, MSH6, MUTYH, NF1, NTHL1, PALB2, PMS2, PTEN, RAD51C, RAD51D, SMAD4, SMARCA4, STK11 and TP53 (sequencing and deletion/duplication); AXIN2, HOXB13, MSH3, POLD1 and POLE (sequencing only); EPCAM and GREM1 (deletion/duplication only).       FAMILY HISTORY:  We obtained a detailed, 4-generation family history.  Significant diagnoses are listed below:      Family History  Problem Relation Age of Onset   Colon cancer Maternal Aunt          dx >50   Colon cancer Maternal Aunt          dx <50   Throat cancer Paternal Uncle          dx >50; or lung?   Breast cancer Maternal Grandmother           dx 20s   Lung cancer Paternal Grandfather          dx >50       Ms. Meggitt is unaware of previous family history of genetic testing for hereditary cancer risks. There is no reported Ashkenazi Jewish ancestry. There is no known consanguinity.    GENETIC TEST RESULTS:  The [facility] CancerNext+RNA Panel found no pathogenic mutations.   The [facility] CancerNext+RNAinsight Panel includes sequencing, rearrangement analysis, and RNA analysis for the following 34 genes: APC, ATM, BARD1, BMPR1A, BRCA1, BRCA2, BRIP1, CDH1, CDK4, CDKN2A, CHEK2, DICER1, MLH1, MSH2, MSH6, MUTYH, NF1, NTHL1, PALB2, PMS2, PTEN, RAD51C, RAD51D, SMAD4, SMARCA4, STK11 and TP53 (sequencing and deletion/duplication); AXIN2, HOXB13, MSH3, POLD1 and POLE (sequencing only); EPCAM and GREM1 (deletion/duplication only). .   The test report has been scanned into EPIC and is located under the Molecular Pathology section of the Results Review tab.  A portion of the result report is included below for reference. Genetic testing reported out on 05/02/2023.      Even though a pathogenic variant was not identified, possible explanations for the cancer in the family may include: There may be no hereditary risk for cancer in the family. The cancers in Ms. Seiple and/or her family may be sporadic/familial or due to other genetic and environmental factors.  Most cancer is not hereditary.  There may be a gene mutation in one of these genes that current testing methods cannot detect but that chance is  small. There could be another gene that has not yet been discovered, or that we have not yet tested, that is responsible for the cancer diagnoses in the family.  It is also possible there is a hereditary cause for the cancer in the family that Ms. Campusano did not inherit.   Therefore, it is important to remain in touch with cancer genetics in the future so that we can continue to offer Ms. Dobbins the most up to date genetic testing.     ADDITIONAL GENETIC TESTING:   Ms. Sooter genetic testing was fairly extensive.  If there are additional relevant genes identified to increase cancer risk that can be analyzed in the future, we would be happy to discuss and coordinate this testing at that time.     CANCER SCREENING RECOMMENDATIONS:  Ms. Leithead test result is considered negative (normal).  This means that we have not identified a hereditary cause for her personal history of breast cancer at this time.    An individual's cancer risk and medical management are not determined by genetic test results alone. Overall cancer risk assessment incorporates additional factors, including personal medical history, family history, and any available genetic information that may result in a personalized plan for cancer prevention and surveillance. Therefore, it is recommended she continue to follow the cancer management and screening guidelines provided by her oncology and primary healthcare provider.    RECOMMENDATIONS FOR FAMILY MEMBERS:   Since she did not inherit a identifiable mutation in a cancer predisposition gene included on this panel, her children could not have inherited a known mutation from her in one of these genes. Individuals in this family might be at some increased risk of developing cancer, over the general population risk, due to the family history of cancer.  Individuals in the family should notify their providers of the family history of cancer. We recommend women in this family have a yearly mammogram beginning at age 56, or 68 years younger than the earliest onset of cancer, an annual clinical breast exam, and perform monthly breast self-exams.  Risk models that take into account family history and hormonal history may be helpful in determining appropriate breast cancer screening options for family members.  First degree relatives of those with colon cancer should receive colonoscopies beginning at age 57, or 10  years prior to the earliest diagnosis of colon cancer in the family, and receive colonoscopies at least every 5 years, or as recommended by their gastroenterologist.   Other members of the family may still carry a pathogenic variant in one of these genes that Ms. Rzepecki did not inherit. Based on the family history, we recommend her maternal aunt, who was diagnosed with colon cancer before age 13, have genetic counseling and testing. Ms. Hade can let us know if we can be of any assistance in coordinating genetic counseling and/or testing for these family member.     FOLLOW-UP:  Cancer genetics is a rapidly advancing field and it is possible that new genetic tests will be appropriate for her and/or her family members in the future. We encourage Ms. Eborn to remain in contact with cancer genetics, so we can update her personal and family histories and let her know of advances in cancer genetics that may benefit this family.   Our contact number was provided.  They are welcome to call us at anytime with additional questions or concerns.   Sophronia Varney M. Rennie Plowman, MS, [facility] Genetic Counselor [email or web address] (P) [phone]

## 2023-05-04 ENCOUNTER — Other Ambulatory Visit: Payer: Self-pay

## 2023-05-04 ENCOUNTER — Ambulatory Visit (HOSPITAL_COMMUNITY)
Admission: RE | Admit: 2023-05-04 | Discharge: 2023-05-04 | Disposition: A | Discharge status: Home / Self Care | Payer: Managed Care, Other (non HMO) | Source: Ambulatory Visit | Attending: General Surgery | Admitting: General Surgery

## 2023-05-04 ENCOUNTER — Ambulatory Visit
Admission: RE | Admit: 2023-05-04 | Discharge: 2023-05-04 | Disposition: A | Discharge status: Home / Self Care | Payer: Managed Care, Other (non HMO) | Source: Ambulatory Visit | Attending: General Surgery | Admitting: General Surgery

## 2023-05-04 ENCOUNTER — Encounter (HOSPITAL_COMMUNITY): Payer: Self-pay | Admitting: General Surgery

## 2023-05-04 ENCOUNTER — Other Ambulatory Visit (HOSPITAL_COMMUNITY): Payer: Self-pay

## 2023-05-04 ENCOUNTER — Encounter (HOSPITAL_COMMUNITY)
Admission: RE | Disposition: A | Discharge status: Home / Self Care | Payer: Self-pay | Source: Ambulatory Visit | Attending: General Surgery

## 2023-05-04 ENCOUNTER — Ambulatory Visit (HOSPITAL_COMMUNITY): Payer: Self-pay | Admitting: Anesthesiology

## 2023-05-04 ENCOUNTER — Ambulatory Visit (HOSPITAL_COMMUNITY): Payer: Self-pay | Admitting: Vascular Surgery

## 2023-05-04 DIAGNOSIS — Z1721 Progesterone receptor positive status: Secondary | ICD-10-CM | POA: Diagnosis not present

## 2023-05-04 DIAGNOSIS — I472 Ventricular tachycardia, unspecified: Secondary | ICD-10-CM | POA: Diagnosis not present

## 2023-05-04 DIAGNOSIS — I10 Essential (primary) hypertension: Secondary | ICD-10-CM | POA: Insufficient documentation

## 2023-05-04 DIAGNOSIS — F17210 Nicotine dependence, cigarettes, uncomplicated: Secondary | ICD-10-CM | POA: Insufficient documentation

## 2023-05-04 DIAGNOSIS — C773 Secondary and unspecified malignant neoplasm of axilla and upper limb lymph nodes: Secondary | ICD-10-CM | POA: Insufficient documentation

## 2023-05-04 DIAGNOSIS — C50412 Malignant neoplasm of upper-outer quadrant of left female breast: Secondary | ICD-10-CM | POA: Diagnosis present

## 2023-05-04 DIAGNOSIS — J45909 Unspecified asthma, uncomplicated: Secondary | ICD-10-CM | POA: Insufficient documentation

## 2023-05-04 DIAGNOSIS — M1990 Unspecified osteoarthritis, unspecified site: Secondary | ICD-10-CM | POA: Diagnosis not present

## 2023-05-04 DIAGNOSIS — I340 Nonrheumatic mitral (valve) insufficiency: Secondary | ICD-10-CM | POA: Insufficient documentation

## 2023-05-04 DIAGNOSIS — Z79899 Other long term (current) drug therapy: Secondary | ICD-10-CM | POA: Insufficient documentation

## 2023-05-04 DIAGNOSIS — C50912 Malignant neoplasm of unspecified site of left female breast: Secondary | ICD-10-CM

## 2023-05-04 DIAGNOSIS — Z170 Estrogen receptor positive status [ER+]: Secondary | ICD-10-CM

## 2023-05-04 HISTORY — PX: AXILLARY LYMPH NODE BIOPSY: SHX5737

## 2023-05-04 HISTORY — PX: BREAST LUMPECTOMY WITH RADIOACTIVE SEED LOCALIZATION: SHX6424

## 2023-05-04 SURGERY — BREAST LUMPECTOMY WITH RADIOACTIVE SEED LOCALIZATION
Anesthesia: General | Site: Breast | Laterality: Left

## 2023-05-04 MED ORDER — ENSURE PRE-SURGERY PO LIQD: 296.0000 mL | Freq: Once | ORAL | Status: DC

## 2023-05-04 MED ORDER — ACETAMINOPHEN 500 MG PO TABS: 1000.0000 mg | ORAL_TABLET | ORAL | Status: DC

## 2023-05-04 MED ORDER — MIDAZOLAM HCL 2 MG/2ML IJ SOLN
INTRAMUSCULAR | Status: AC
  Filled 2023-05-04: qty 2

## 2023-05-04 MED ORDER — CHLORHEXIDINE GLUCONATE CLOTH 2 % EX PADS: 6.0000 | MEDICATED_PAD | Freq: Once | CUTANEOUS | Status: DC

## 2023-05-04 MED ORDER — CHLORHEXIDINE GLUCONATE 0.12 % MT SOLN
15.0000 mL | Freq: Once | OROMUCOSAL | Status: AC
Start: 2023-05-04 — End: 2023-05-04
  Administered 2023-05-04: 15 mL via OROMUCOSAL
  Filled 2023-05-04: qty 15

## 2023-05-04 MED ORDER — ACETAMINOPHEN 650 MG RE SUPP: 650.0000 mg | RECTAL | Status: DC | PRN

## 2023-05-04 MED ORDER — ACETAMINOPHEN 325 MG PO TABS: 650.0000 mg | ORAL_TABLET | ORAL | Status: DC | PRN

## 2023-05-04 MED ORDER — CELECOXIB 200 MG PO CAPS
200.0000 mg | ORAL_CAPSULE | Freq: Once | ORAL | Status: DC
  Filled 2023-05-04: qty 1

## 2023-05-04 MED ORDER — MIDAZOLAM HCL 2 MG/2ML IJ SOLN
INTRAMUSCULAR | Status: DC | PRN
  Administered 2023-05-04: 2 mg via INTRAVENOUS

## 2023-05-04 MED ORDER — DEXAMETHASONE SODIUM PHOSPHATE 10 MG/ML IJ SOLN
INTRAMUSCULAR | Status: DC | PRN
  Administered 2023-05-04: 10 mg via INTRAVENOUS

## 2023-05-04 MED ORDER — LIDOCAINE 2% (20 MG/ML) 5 ML SYRINGE
INTRAMUSCULAR | Status: DC | PRN
  Administered 2023-05-04: 40 mg via INTRAVENOUS

## 2023-05-04 MED ORDER — ACETAMINOPHEN 500 MG PO TABS
1000.0000 mg | ORAL_TABLET | Freq: Once | ORAL | Status: AC
  Administered 2023-05-04: 1000 mg via ORAL
  Filled 2023-05-04: qty 2

## 2023-05-04 MED ORDER — OXYCODONE HCL 5 MG PO TABS
ORAL_TABLET | ORAL | Status: AC
  Filled 2023-05-04: qty 1

## 2023-05-04 MED ORDER — FENTANYL CITRATE (PF) 250 MCG/5ML IJ SOLN
INTRAMUSCULAR | Status: AC
  Filled 2023-05-04: qty 5

## 2023-05-04 MED ORDER — HEMOSTATIC AGENTS (NO CHARGE) OPTIME
TOPICAL | Status: DC | PRN
  Administered 2023-05-04: 1 via TOPICAL

## 2023-05-04 MED ORDER — OXYCODONE HCL 5 MG PO TABS
5.0000 mg | ORAL_TABLET | Freq: Four times a day (QID) | ORAL | 0 refills | Status: DC | PRN
  Filled 2023-05-04: qty 10, 3d supply, fill #0

## 2023-05-04 MED ORDER — LACTATED RINGERS IV SOLN: INTRAVENOUS | Status: DC

## 2023-05-04 MED ORDER — BUPIVACAINE-EPINEPHRINE (PF) 0.25% -1:200000 IJ SOLN
INTRAMUSCULAR | Status: AC
Start: 2023-05-04 — End: ?
  Filled 2023-05-04: qty 30

## 2023-05-04 MED ORDER — BUPIVACAINE LIPOSOME 1.3 % IJ SUSP
INTRAMUSCULAR | Status: DC | PRN
  Administered 2023-05-04: 10 mL via PERINEURAL

## 2023-05-04 MED ORDER — OXYCODONE HCL 5 MG PO TABS
5.0000 mg | ORAL_TABLET | ORAL | Status: DC | PRN
  Administered 2023-05-04: 5 mg via ORAL

## 2023-05-04 MED ORDER — ORAL CARE MOUTH RINSE: 15.0000 mL | Freq: Once | OROMUCOSAL | Status: AC

## 2023-05-04 MED ORDER — FENTANYL CITRATE (PF) 250 MCG/5ML IJ SOLN
INTRAMUSCULAR | Status: DC | PRN
  Administered 2023-05-04: 25 ug via INTRAVENOUS
  Administered 2023-05-04: 100 ug via INTRAVENOUS
  Administered 2023-05-04: 25 ug via INTRAVENOUS

## 2023-05-04 MED ORDER — SODIUM CHLORIDE 0.9% FLUSH: 3.0000 mL | Freq: Two times a day (BID) | INTRAVENOUS | Status: DC

## 2023-05-04 MED ORDER — MAGTRACE LYMPHATIC TRACER
INTRAMUSCULAR | Status: DC | PRN
  Administered 2023-05-04: 2 mL via INTRAMUSCULAR

## 2023-05-04 MED ORDER — 0.9 % SODIUM CHLORIDE (POUR BTL) OPTIME
TOPICAL | Status: DC | PRN
  Administered 2023-05-04: 1000 mL

## 2023-05-04 MED ORDER — ONDANSETRON HCL 4 MG/2ML IJ SOLN
INTRAMUSCULAR | Status: AC
  Filled 2023-05-04: qty 2

## 2023-05-04 MED ORDER — SODIUM CHLORIDE 0.9% FLUSH: 3.0000 mL | INTRAVENOUS | Status: DC | PRN

## 2023-05-04 MED ORDER — SODIUM CHLORIDE 0.9 % IV SOLN: 250.0000 mL | INTRAVENOUS | Status: DC | PRN

## 2023-05-04 MED ORDER — BUPIVACAINE-EPINEPHRINE 0.25% -1:200000 IJ SOLN
INTRAMUSCULAR | Status: DC | PRN
  Administered 2023-05-04: 10 mL

## 2023-05-04 MED ORDER — CEFAZOLIN SODIUM-DEXTROSE 2-4 GM/100ML-% IV SOLN
2.0000 g | INTRAVENOUS | Status: AC
  Administered 2023-05-04: 2 g via INTRAVENOUS
  Filled 2023-05-04: qty 100

## 2023-05-04 MED ORDER — PROPOFOL 10 MG/ML IV BOLUS
INTRAVENOUS | Status: DC | PRN
  Administered 2023-05-04: 150 mg via INTRAVENOUS
  Administered 2023-05-04: 50 mg via INTRAVENOUS

## 2023-05-04 MED ORDER — ROPIVACAINE HCL 5 MG/ML IJ SOLN
INTRAMUSCULAR | Status: DC | PRN
  Administered 2023-05-04: 20 mL via PERINEURAL

## 2023-05-04 MED ORDER — LIDOCAINE 2% (20 MG/ML) 5 ML SYRINGE
INTRAMUSCULAR | Status: AC
  Filled 2023-05-04: qty 5

## 2023-05-04 MED ORDER — FENTANYL CITRATE (PF) 100 MCG/2ML IJ SOLN
25.0000 ug | INTRAMUSCULAR | Status: DC | PRN
Start: 2023-05-04 — End: 2023-05-04

## 2023-05-04 MED ORDER — KETOROLAC TROMETHAMINE 15 MG/ML IJ SOLN
15.0000 mg | INTRAMUSCULAR | Status: AC
  Administered 2023-05-04: 15 mg via INTRAVENOUS
  Filled 2023-05-04: qty 1

## 2023-05-04 MED ORDER — ONDANSETRON HCL 4 MG/2ML IJ SOLN
INTRAMUSCULAR | Status: DC | PRN
  Administered 2023-05-04: 4 mg via INTRAVENOUS

## 2023-05-04 MED ORDER — AMISULPRIDE (ANTIEMETIC) 5 MG/2ML IV SOLN: 10.0000 mg | Freq: Once | INTRAVENOUS | Status: DC | PRN

## 2023-05-04 MED ORDER — DEXAMETHASONE SODIUM PHOSPHATE 10 MG/ML IJ SOLN
INTRAMUSCULAR | Status: AC
  Filled 2023-05-04: qty 1

## 2023-05-04 SURGICAL SUPPLY — 35 items
APPLIER CLIP 9.375 MED OPEN (MISCELLANEOUS) ×2
BAG COUNTER SPONGE SURGICOUNT (BAG) ×2 IMPLANT
BINDER BREAST LRG (GAUZE/BANDAGES/DRESSINGS) IMPLANT
CANISTER SUCT 3000ML PPV (MISCELLANEOUS) ×2 IMPLANT
CHLORAPREP W/TINT 26 (MISCELLANEOUS) ×2 IMPLANT
CLIP APPLIE 9.375 MED OPEN (MISCELLANEOUS) IMPLANT
CLIP TI MEDIUM 6 (CLIP) ×2 IMPLANT
COVER PROBE W GEL 5X96 (DRAPES) ×2 IMPLANT
COVER SURGICAL LIGHT HANDLE (MISCELLANEOUS) ×2 IMPLANT
DERMABOND ADVANCED .7 DNX12 (GAUZE/BANDAGES/DRESSINGS) ×2 IMPLANT
DEVICE DUBIN SPECIMEN MAMMOGRA (MISCELLANEOUS) ×2 IMPLANT
DRAPE CHEST BREAST 15X10 FENES (DRAPES) ×2 IMPLANT
ELECT COATED BLADE 2.86 ST (ELECTRODE) ×2 IMPLANT
ELECT REM PT RETURN 9FT ADLT (ELECTROSURGICAL) ×2
ELECTRODE REM PT RTRN 9FT ADLT (ELECTROSURGICAL) ×2 IMPLANT
GLOVE BIO SURGEON STRL SZ7 (GLOVE) ×4 IMPLANT
GLOVE BIOGEL PI IND STRL 7.5 (GLOVE) ×2 IMPLANT
GOWN STRL REUS W/ TWL LRG LVL3 (GOWN DISPOSABLE) ×4 IMPLANT
HEMOSTAT ARISTA ABSORB 3G PWDR (HEMOSTASIS) IMPLANT
KIT BASIN OR (CUSTOM PROCEDURE TRAY) ×2 IMPLANT
KIT MARKER MARGIN INK (KITS) ×2 IMPLANT
NDL HYPO 25GX1X1/2 BEV (NEEDLE) ×2 IMPLANT
NEEDLE HYPO 25GX1X1/2 BEV (NEEDLE) ×2
NS IRRIG 1000ML POUR BTL (IV SOLUTION) ×2 IMPLANT
PACK GENERAL/GYN (CUSTOM PROCEDURE TRAY) ×2 IMPLANT
RETRACTOR ONETRAX LX 90X20 (MISCELLANEOUS) IMPLANT
STRIP CLOSURE SKIN 1/2X4 (GAUZE/BANDAGES/DRESSINGS) ×2 IMPLANT
SUT MNCRL AB 4-0 PS2 18 (SUTURE) ×2 IMPLANT
SUT MON AB 5-0 PS2 18 (SUTURE) IMPLANT
SUT SILK 2 0 SH (SUTURE) IMPLANT
SUT VIC AB 2-0 SH 27X BRD (SUTURE) IMPLANT
SUT VIC AB 2-0 SH 27XBRD (SUTURE) ×2 IMPLANT
SUT VIC AB 3-0 SH 27X BRD (SUTURE) ×2 IMPLANT
SYR CONTROL 10ML LL (SYRINGE) ×2 IMPLANT
TOWEL GREEN STERILE (TOWEL DISPOSABLE) ×2 IMPLANT

## 2023-05-04 NOTE — Transfer of Care (Signed)
Immediate Anesthesia Transfer of Care Note  Patient: Heidi Nolan  Procedure(s) Performed: LEFT BREAST SEED BRACKETED LUMPECTOMY (Left: Breast) LEFT AXILLARY SENTINEL LYMPH NODE BIOPSY (Left: Axilla)  Patient Location: PACU  Anesthesia Type:General  Level of Consciousness: awake, alert , and oriented  Airway & Oxygen Therapy: Patient Spontanous Breathing  Post-op Assessment: Report given to RN, Post -op Vital signs reviewed and stable, and Patient moving all extremities X 4  Post vital signs: Reviewed and stable  Last Vitals:  Vitals Value Taken Time  BP 148/77 05/04/23 0955  Temp 36.1 C 05/04/23 0955  Pulse 88 05/04/23 0956  Resp 15 05/04/23 0956  SpO2 98 % 05/04/23 0956  Vitals shown include unfiled device data.  Last Pain:  Vitals:   05/04/23 0725  TempSrc:   PainSc: 0-No pain         Complications: There were no known notable events for this encounter.

## 2023-05-04 NOTE — Discharge Instructions (Signed)
[facility] Office Phone Number [phone]  POST OP INSTRUCTIONS Take 400 mg of ibuprofen every 8 hours or 650 mg tylenol every 6 hours for next 72 hours then as needed. Use ice several times daily also.  A prescription for pain medication may be given to you upon discharge.  Take your pain medication as prescribed, if needed.  If narcotic pain medicine is not needed, then you may take acetaminophen (Tylenol), naprosyn (Alleve) or ibuprofen (Advil) as needed. Take your usually prescribed medications unless otherwise directed If you need a refill on your pain medication, please contact your pharmacy.  They will contact our office to request authorization.  Prescriptions will not be filled after 5pm or on week-ends. You should eat very light the first 24 hours after surgery, such as soup, crackers, pudding, etc.  Resume your normal diet the day after surgery. Most patients will experience some swelling and bruising in the breast.  Ice packs and a good support bra will help.  Wear the breast binder provided or a sports bra for 72 hours day and night.  After that wear a sports bra during the day until you return to the office. Swelling and bruising can take several days to resolve.  It is common to experience some constipation if taking pain medication after surgery.  Increasing fluid intake and taking a stool softener will usually help or prevent this problem from occurring.  A mild laxative (Milk of Magnesia or Miralax) should be taken according to package directions if there are no bowel movements after 48 hours. I used skin glue on the incision, you may shower in 24 hours.  The glue will flake off over the next 2-3 weeks.  Any sutures or staples will be removed at the office during your follow-up visit. ACTIVITIES:  You may resume regular daily activities (gradually increasing) beginning the next day.  Wearing a good support bra or sports bra minimizes pain and swelling.  You may have  sexual intercourse when it is comfortable. You may drive when you no longer are taking prescription pain medication, you can comfortably wear a seatbelt, and you can safely maneuver your car and apply brakes. RETURN TO WORK:  ______________________________________________________________________________________ You should see your doctor in the office for a follow-up appointment approximately two weeks after your surgery.  Your doctor's nurse will typically make your follow-up appointment when she calls you with your pathology report.  Expect your pathology report 3-4 business days after your surgery.  You may call to check if you do not hear from us after three days. OTHER INSTRUCTIONS: _______________________________________________________________________________________________ _____________________________________________________________________________________________________________________________________ _____________________________________________________________________________________________________________________________________ _____________________________________________________________________________________________________________________________________  WHEN TO CALL DR Jomari Bartnik: Fever over 101.0 Nausea and/or vomiting. Extreme swelling or bruising. Continued bleeding from incision. Increased pain, redness, or drainage from the incision.  The clinic staff is available to answer your questions during regular business hours.  Please don't hesitate to call and ask to speak to one of the nurses for clinical concerns.  If you have a medical emergency, go to the nearest emergency room or call 911.  A surgeon from [facility] is always on call at the hospital.  For further questions, please visit [email or web address]  

## 2023-05-04 NOTE — Op Note (Signed)
Preoperative diagnosis: Clinical stage I left breast cancer Postoperative diagnosis: Same as above Procedure: 1.  Left breast radioactive seed guided lumpectomy 2.  Injection of mag trace for sentinel lymph node identification 3.  Left deep axillary sentinel lymph node biopsy Surgeon: Dr. Harden Mo Anesthesia: General With a pectoral block Estimated blood loss: Minimal Complications: None Drains: Specimens: 1.  Left breast tissue containing seed and clip marked with paint 2.  Left deep axillary sentinel lymph nodes with highest count of 1824 3.Additional lateral and superior margins marked short superior, long lateral, double deep Sponge needle count was correct completion Disposition recovery stable addition  Indications:52 yof who has no prior breast history and had screening mm. This shows c density breast tissue. There is a mass at 1 oclock that is 1.5x1.4x0.9 cm and an adjacent mass that measures 1.1 cm in size. The whole span is 2.6 cm. Ax [place] is negative. Biopsy is a grade I IDC that is 60% er pos, 70% pr pos, her 2 negative , Ki is 5%.  We discussed proceeding with a lumpectomy and sentinel lymph node biopsy.  Procedure: After informed consent was obtained she first underwent a pectoral block.  She was given antibiotics.  SCDs were in place.  She had a seed placed prior to this and I had these mammograms available in the operating room.  She was then placed under general anesthesia.  She was prepped and draped in a sterile sterile surgical fashion.  Surgical timeout was then performed.  She had an Exparel for a block and we calculated I could use 15 cc of local.  I injected Marcaine peritumoral he before beginning.  I injected 1 cc of mag trace in the subareolar position and an additional 1 cc.  Tumor really.  I massaged this for 5 minutes.  I did the lumpectomy and sentinel node through a single upper outer quadrant incision.  I made the incision and used the neoprobe to  dissected the seed.  I remove the radioactive seed in the surrounding tissue with an attempt to get a clear margin.  Mammogram confirmed removal of the seed and the clip.  I did 3D imaging and thought I might be close to 2 margins and remove these as above.  The posterior margin is the muscle as I remove the fascia with the primary tumor.  Hemostasis was obtained.  I then entered into the axilla through the same incision.  I used the Sentimag probe to identify what appeared to be a couple of lymph nodes that were brown stained and very active.  I remove these.  The highest count is listed as above.  There were no background activity or any palpable nodes or any visible nodes.  I then obtained hemostasis in the axilla.  I closed this with 2-0 Vicryl.  I placed some clips in the breast cavity.  I closed the breast cavity down with 2-0 Vicryl.  The skin was closed with 3-0 Vicryl and 4 Monocryl.  Glue and Steri-Strips were applied.  She tolerated this well was extubated and transferred recovery stable.

## 2023-05-04 NOTE — Anesthesia Procedure Notes (Signed)
Anesthesia Regional Block: Pectoralis block   Pre-Anesthetic Checklist: , timeout performed,  Correct Patient, Correct Site, Correct Laterality,  Correct Procedure, Correct Position, site marked,  Risks and benefits discussed,  Surgical consent,  Pre-op evaluation,  At surgeon's request and post-op pain management  Laterality: Left  Prep: Maximum Sterile Barrier Precautions used, chloraprep       Needles:  Injection technique: Single-shot  Needle Type: Echogenic Stimulator Needle     Needle Length: 9cm  Needle Gauge: 22     Additional Needles:   Procedures:,,,, ultrasound used (permanent image in chart),,    Narrative:  Start time: 05/04/2023 8:15 AM End time: 05/04/2023 8:20 AM Injection made incrementally with aspirations every 5 mL.  Performed by: Personally  Anesthesiologist: Lannie Fields, DO  Additional Notes: Monitors applied. No increased pain on injection. No increased resistance to injection. Injection made in 5cc increments. Good needle visualization. Patient tolerated procedure well.

## 2023-05-04 NOTE — Anesthesia Procedure Notes (Signed)
Procedure Name: Intubation Date/Time: 05/04/2023 8:50 AM  Performed by: Sharyn Dross, CRNAPre-anesthesia Checklist: Patient identified, Emergency Drugs available, Suction available and Patient being monitored Patient Re-evaluated:Patient Re-evaluated prior to induction Oxygen Delivery Method: Circle system utilized Preoxygenation: Pre-oxygenation with 100% oxygen Induction Type: IV induction Ventilation: Mask ventilation without difficulty LMA: LMA inserted LMA Size: 4.0 Number of attempts: 1 Placement Confirmation: positive ETCO2 and breath sounds checked- equal and bilateral Tube secured with: Tape Dental Injury: Teeth and Oropharynx as per pre-operative assessment

## 2023-05-05 ENCOUNTER — Encounter (HOSPITAL_COMMUNITY): Payer: Self-pay | Admitting: General Surgery

## 2023-05-05 LAB — SURGICAL PATHOLOGY

## 2023-05-05 NOTE — Anesthesia Postprocedure Evaluation (Signed)
Anesthesia Post Note  Patient: MAEVIS DUMITRESCU  Procedure(s) Performed: LEFT BREAST SEED BRACKETED LUMPECTOMY (Left: Breast) LEFT AXILLARY SENTINEL LYMPH NODE BIOPSY (Left: Axilla)     Patient location during evaluation: PACU Anesthesia Type: General Level of consciousness: awake and alert Pain management: pain level controlled Vital Signs Assessment: post-procedure vital signs reviewed and stable Respiratory status: spontaneous breathing, nonlabored ventilation, respiratory function stable and patient connected to nasal cannula oxygen Cardiovascular status: blood pressure returned to baseline and stable Postop Assessment: no apparent nausea or vomiting Anesthetic complications: no   There were no known notable events for this encounter.  Last Vitals:  Vitals:   05/04/23 1015 05/04/23 1030  BP: (!) 155/92 (!) 148/93  Pulse: 75 71  Resp: 18 14  Temp:  (!) 36.3 C  SpO2: 99% 98%    Last Pain:  Vitals:   05/04/23 1015  TempSrc:   PainSc: 3                  Fallan Mccarey P [facility]

## 2023-05-10 ENCOUNTER — Telehealth: Payer: Self-pay | Admitting: *Deleted

## 2023-05-10 ENCOUNTER — Encounter: Payer: Self-pay | Admitting: *Deleted

## 2023-05-10 NOTE — Telephone Encounter (Signed)
Received order for oncotype testing. Requisition sent to pathology.

## 2023-05-19 ENCOUNTER — Encounter (HOSPITAL_COMMUNITY): Payer: Self-pay

## 2023-05-23 ENCOUNTER — Encounter: Payer: Self-pay | Admitting: *Deleted

## 2023-05-23 ENCOUNTER — Telehealth: Payer: Self-pay | Admitting: *Deleted

## 2023-05-23 ENCOUNTER — Other Ambulatory Visit: Payer: Self-pay | Admitting: *Deleted

## 2023-05-23 ENCOUNTER — Encounter: Payer: Self-pay | Admitting: Physical Therapy

## 2023-05-23 ENCOUNTER — Ambulatory Visit: Payer: Managed Care, Other (non HMO) | Attending: General Surgery | Admitting: Physical Therapy

## 2023-05-23 DIAGNOSIS — C50412 Malignant neoplasm of upper-outer quadrant of left female breast: Secondary | ICD-10-CM | POA: Diagnosis present

## 2023-05-23 DIAGNOSIS — R293 Abnormal posture: Secondary | ICD-10-CM | POA: Diagnosis present

## 2023-05-23 DIAGNOSIS — Z483 Aftercare following surgery for neoplasm: Secondary | ICD-10-CM | POA: Diagnosis present

## 2023-05-23 DIAGNOSIS — Z170 Estrogen receptor positive status [ER+]: Secondary | ICD-10-CM | POA: Insufficient documentation

## 2023-05-23 DIAGNOSIS — M722 Plantar fascial fibromatosis: Secondary | ICD-10-CM

## 2023-05-23 NOTE — Patient Instructions (Signed)
[facility]  [address], [address]  [place] [address]  [phone]  After Breast Cancer Class It is recommended you attend the ABC class to be educated on lymphedema risk reduction. This class is free of charge and lasts for 1 hour. It is a 1-time class. You will need to download the TEAMS app either on your phone or computer. We will send you a link the night before or the morning of the class. You should be able to click on that link to join the class. This is not a confidential class. You don't have to turn your camera on, but other participants may be able to see your email address. You are scheduled for December 16th at 12:00.   Scar massage You can begin gentle scar massage to you incision sites. Gently place one hand on the incision and move the skin (without sliding on the skin) in various directions. Do this for a few minutes and then you can gently massage either coconut oil or vitamin E cream into the scars.  Compression garment You should continue wearing your compression bra until you feel like you no longer have swelling. Add the swell spot to reduce swelling.  Home exercise Program Continue doing the exercises you were given until you feel like you can do them without feeling any tightness at the end.   Walking Program Studies show that 30 minutes of walking per day (fast enough to elevate your heart rate) can significantly reduce the risk of a cancer recurrence. If you can't walk due to other medical reasons, we encourage you to find another activity you could do (like a stationary bike or water exercise).  Posture After breast cancer surgery, people frequently sit with rounded shoulders posture because it puts their incisions on slack and feels better. If you sit like this and scar tissue forms in that position, you can become very tight and have pain sitting or standing with good posture. Try to be aware of your posture and sit and stand up tall to  heal properly.  Follow up PT: It is recommended you return every 3 months for the first 3 years following surgery to be assessed on the SOZO machine for an L-Dex score. This helps prevent clinically significant lymphedema in 95% of patients. These follow up screens are 10 minute appointments that you are not billed for.

## 2023-05-23 NOTE — Telephone Encounter (Signed)
Received oncotype score of 20. Physician team notified. Referral placed for pt to see Dr. Mitzi Hansen

## 2023-05-23 NOTE — Therapy (Signed)
OUTPATIENT PHYSICAL THERAPY BREAST CANCER POST OP FOLLOW UP   Patient Name: Heidi Nolan MRN: [id number] DOB:November 16, 1970, 52 y.o., female Today's Date: 05/23/2023  END OF SESSION:  PT End of Session - 05/23/23 0958     Visit Number 2    PT Start Time 0958    Activity Tolerance Patient tolerated treatment well    Behavior During Therapy [facility] for tasks assessed/performed             Past Medical History:  Diagnosis Date   Abnormal Pap smear of cervix    ADHD (attention deficit hyperactivity disorder)    Arthritis    hips and back   Cancer of left breast (HCC)    Cervical disc disease    SVT (supraventricular tachycardia) (HCC)    Past Surgical History:  Procedure Laterality Date   AXILLARY LYMPH NODE BIOPSY Left 05/04/2023   Procedure: LEFT AXILLARY SENTINEL LYMPH NODE BIOPSY;  Surgeon: Emelia Loron, MD;  Location: [facility] OR;  Service: General;  Laterality: Left;   BREAST BIOPSY Left 04/08/2023   [place] LT BREAST BX W LOC DEV 1ST LESION IMG BX SPEC US GUIDE 04/08/2023 [facility] MAMMOGRAPHY   BREAST BIOPSY  05/03/2023   MM LT RADIOACTIVE SEED LOC MAMMO GUIDE 05/03/2023 [facility] MAMMOGRAPHY   BREAST LUMPECTOMY WITH RADIOACTIVE SEED LOCALIZATION Left 05/04/2023   Procedure: LEFT BREAST SEED BRACKETED LUMPECTOMY;  Surgeon: Emelia Loron, MD;  Location: [facility] OR;  Service: General;  Laterality: Left;   CESAREAN SECTION  1991   NECK SURGERY  9/14   [facility]   Patient Active Problem List   Diagnosis Date Noted   Genetic testing 05/02/2023   Malignant neoplasm of upper-outer quadrant of left breast in female, estrogen receptor positive (HCC) 04/18/2023   HSV (herpes simplex virus) anogenital infection 09/01/2014   Rectal fissure 09/01/2014   Cervical spinal stenosis 03/01/2013    REFERRING PROVIDER: Dr. Emelia Loron  REFERRING DIAG: Left breast cancer  THERAPY DIAG:  Malignant neoplasm of upper-outer quadrant of left breast in female, estrogen receptor positive  (HCC)  Abnormal posture  Aftercare following surgery for neoplasm  Rationale for Evaluation and Treatment: Rehabilitation  ONSET DATE: 05/04/2023  SUBJECTIVE:                                                                                                                                                                                           SUBJECTIVE STATEMENT: Patient reports she underwent a left lumpectomy and sentinel node biopsy (1 of 2 nodes positive for carcinoma) on 05/04/2023. Her Oncotype score result is 20 so no chemo is needed. She will proceed to radiation (which will  likely include her axilla) and anti-estrogen therapy.  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 She underwent a left lumpectomy and sentinel node biopsy (1 of 2 nodes positive for carcinoma) on 05/04/2023. It is ER/PR positive and HER2 negative with a Ki67 of 5%.   PATIENT GOALS:  Reassess how my recovery is going related to arm function, pain, and swelling.  PAIN:  Are you having pain? 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    OBJECTIVE:   PATIENT SURVEYS:  QUICK DASH:  Quick Dash - 05/23/23 0001     Open a tight or new jar Mild difficulty    Do heavy household chores (wash walls, wash floors) No difficulty    Carry a shopping bag or briefcase Mild difficulty    Wash your back Mild difficulty    Use a knife to cut food No difficulty    Recreational activities in which you take some force or impact through your arm, shoulder, or hand (golf, hammering, tennis) Mild difficulty    During the past week, to what extent has your arm, shoulder or hand problem interfered with your normal social activities with family, friends, neighbors, or groups? Not at all    During the past week, to what extent has your arm, shoulder or hand problem  limited your work or other regular daily activities Not at all    Arm, shoulder, or hand pain. None    Tingling (pins and needles) in your arm, shoulder, or hand Mild    Difficulty Sleeping Moderate difficulty    DASH Score 15.91 %              OBSERVATIONS: Patient appears to have a seroma at her incision site with hardness present. Encouraged her to get a swell spot to reduce swelling. Instructed her to begin scar massage. No signs of infection.  POSTURE:  Forward head and rounded shoulders  LYMPHEDEMA ASSESSMENT:   UPPER EXTREMITY AROM/PROM:   A/PROM RIGHT   eval    Shoulder extension 60  Shoulder flexion 163  Shoulder abduction 171  Shoulder internal rotation 76  Shoulder external rotation 83                          (Blank rows = not tested)   A/PROM LEFT   eval LEFT 05/23/2023  Shoulder extension 59 60  Shoulder flexion 143 142  Shoulder abduction 164 157  Shoulder internal rotation 77 67  Shoulder external rotation 88 81                          (Blank rows = not tested)   CERVICAL AROM: All within normal limits   UPPER EXTREMITY STRENGTH: WNL   LYMPHEDEMA ASSESSMENTS (in cm):    LANDMARK RIGHT   eval RIGHT 05/23/2023  10 cm proximal to olecranon process 27.3 27.3  Olecranon process 23.5 23.3  10 cm proximal to ulnar styloid process 22 21.1  Just proximal to ulnar styloid process 14.9 14.7  Across hand at thumb web space 17.7 18.1  At base of 2nd digit 6.2 6.1  (Blank rows = not tested)   LANDMARK LEFT   eval LEFT 05/23/2023  10 cm proximal to olecranon process 26.5 27.1  Olecranon process 22.9 23.3  10 cm proximal to ulnar styloid process 20.8 21.2  Just proximal to ulnar styloid process 14.9 14.7  Across hand at thumb web space 18.4 18.2  At base of 2nd digit 6.1 6  (Blank rows = not tested)    Surgery type/Date: 05/04/2023 Left lumpectomy and sentinel node biopsy Number of lymph nodes removed: 2 Current/past treatment (chemo,  radiation, hormone therapy): none Other symptoms:  Heaviness/tightness No Pain No Pitting edema No Infections No Decreased scar mobility Yes Stemmer sign No  PATIENT EDUCATION:  Education details: Reviewed HEP and discussed lymphedema precautions; educated on scar massage Person educated: Patient Education method: Explanation Education comprehension: verbalized understanding  HOME EXERCISE PROGRAM: Reviewed previously given post op HEP.   ASSESSMENT:  CLINICAL IMPRESSION: Patient is doing very well s/p left lumpectomy and sentinel node biopsy 05/04/2023. She has a seroma present but was given compression foam and encouraged to purchase a [employer] Swell Spot. She had a positive node so her axilla will likely be included in radiation, increasing her lymphedema risk. We will monitor her closely with the SOZO screens. Otherwise she does not need PT at this time except for her plantar fasciitis which is preventing her from a walking program.   Pt will benefit from skilled therapeutic intervention to improve on the following deficits: Decreased knowledge of precautions, impaired UE functional use, pain, decreased ROM, postural dysfunction.   PT treatment/interventions: ADL/Self care home management, [phone]- PT Re-evaluation, 97110-Therapeutic exercises, 97530- Therapeutic activity, 97535- Self Care, and 32202- Manual therapy   GOALS: Goals reviewed with patient? Yes  LONG TERM GOALS:  (STG=LTG)  GOALS Name Target Date  Goal status  1 Pt will demonstrate she has regained full shoulder ROM and function post operatively compared to baselines.  Baseline: 06/15/2023 INITIAL     PLAN:  PT FREQUENCY/DURATION: N/A  PLAN FOR NEXT SESSION: Will continue with SOZO screens and she has been referred to [facility] for plantar fasciitis.   [facility]  [address], [address]  [place] [address]  [phone]  After Breast Cancer Class It is recommended you attend  the ABC class to be educated on lymphedema risk reduction. This class is free of charge and lasts for 1 hour. It is a 1-time class. You will need to download the TEAMS app either on your phone or computer. We will send you a link the night before or the morning of the class. You should be able to click on that link to join the class. This is not a confidential class. You don't have to turn your camera on, but other participants may be able to see your email address. You are scheduled for December 16th at 12:00.   Scar massage You can begin gentle scar massage to you incision sites. Gently place one hand on the incision and move the skin (without sliding on the skin) in various directions. Do this for a few minutes and then you can gently massage either coconut oil or vitamin E cream into the scars.  Compression garment You should continue wearing your compression bra until you feel like you no longer have swelling. Add the swell spot to reduce swelling.  Home exercise Program Continue doing the exercises you were given until you feel like you can do them without feeling any tightness at the end.   Walking Program Studies show that 30 minutes of walking per day (fast enough to elevate your heart rate) can significantly reduce the risk of a cancer recurrence. If you  can't walk due to other medical reasons, we encourage you to find another activity you could do (like a stationary bike or water exercise).  Posture After breast cancer surgery, people frequently sit with rounded shoulders posture because it puts their incisions on slack and feels better. If you sit like this and scar tissue forms in that position, you can become very tight and have pain sitting or standing with good posture. Try to be aware of your posture and sit and stand up tall to heal properly.  Follow up PT: It is recommended you return every 3 months for the first 3 years following surgery to be assessed on the SOZO machine for an  L-Dex score. This helps prevent clinically significant lymphedema in 95% of patients. These follow up screens are 10 minute appointments that you are not billed for.  PHYSICAL THERAPY DISCHARGE SUMMARY  Visits from Start of Care: 2  Current functional level related to goals / functional outcomes: Seroma present left lateral breast   Remaining deficits: None   Education / Equipment: HEP and lymphedema risk reduction   Patient agrees to discharge. Patient goals were met. Patient is being discharged due to meeting the stated rehab goals.  Bethann Punches, [place] 05/23/23 10:40 AM

## 2023-05-24 ENCOUNTER — Telehealth: Payer: Self-pay | Admitting: Radiation Oncology

## 2023-05-24 NOTE — Telephone Encounter (Signed)
Left message for patient to call back to schedule consult per 12/9 referral.

## 2023-05-30 ENCOUNTER — Ambulatory Visit: Payer: Managed Care, Other (non HMO)

## 2023-05-30 ENCOUNTER — Encounter: Payer: Self-pay | Admitting: *Deleted

## 2023-05-31 ENCOUNTER — Inpatient Hospital Stay: Payer: Managed Care, Other (non HMO) | Attending: Hematology and Oncology | Admitting: Hematology and Oncology

## 2023-05-31 VITALS — BP 143/82 | HR 77 | Temp 97.6°F | Resp 18 | Ht 65.0 in | Wt 145.4 lb

## 2023-05-31 DIAGNOSIS — Z79899 Other long term (current) drug therapy: Secondary | ICD-10-CM | POA: Insufficient documentation

## 2023-05-31 DIAGNOSIS — C50412 Malignant neoplasm of upper-outer quadrant of left female breast: Secondary | ICD-10-CM | POA: Diagnosis not present

## 2023-05-31 DIAGNOSIS — Z170 Estrogen receptor positive status [ER+]: Secondary | ICD-10-CM | POA: Diagnosis not present

## 2023-05-31 DIAGNOSIS — N958 Other specified menopausal and perimenopausal disorders: Secondary | ICD-10-CM | POA: Insufficient documentation

## 2023-05-31 NOTE — Progress Notes (Signed)
Patient Care Team: Soundra Pilon, FNP as PCP - General (Family Medicine) Donnelly Angelica, RN as Oncology Nurse Navigator Pershing Proud, RN as Oncology Nurse Navigator Emelia Loron, MD as Consulting Physician (General Surgery) Serena Croissant, MD as Consulting Physician (Hematology and Oncology) Dorothy Puffer, MD as Consulting Physician (Radiation Oncology)  DIAGNOSIS:  Encounter Diagnosis  Name Primary?   Malignant neoplasm of upper-outer quadrant of left breast in female, estrogen receptor positive (HCC) Yes    SUMMARY OF ONCOLOGIC HISTORY: Oncology History  Malignant neoplasm of upper-outer quadrant of left breast in female, estrogen receptor positive (HCC)  04/08/2023 Initial Diagnosis   Screening mammogram detected left breast 2 masses total measuring 2.6 cm (1.5 cm and 1.1 cm) biopsy revealed grade 1 IDC ER 100% PR 100% HER2 negative, Ki-67 5%   04/20/2023 Cancer Staging   Staging form: Breast, AJCC 8th Edition - Clinical stage from 04/20/2023: Stage IA (cT1c, cN0, cM0, G1, ER+, PR+, HER2-) - Signed by Ronny Bacon, PA-C on 04/20/2023 Stage prefix: Initial diagnosis Histologic grading system: 3 grade system   05/02/2023 Genetic Testing   Negative [facility] CancerNext+RNAinsight Panel.  Report date is 05/02/2023.   The [employer] CancerNext+RNAinsight Panel includes sequencing, rearrangement analysis, and RNA analysis for the following 34 genes: APC, ATM, BARD1, BMPR1A, BRCA1, BRCA2, BRIP1, CDH1, CDK4, CDKN2A, CHEK2, DICER1, MLH1, MSH2, MSH6, MUTYH, NF1, NTHL1, PALB2, PMS2, PTEN, RAD51C, RAD51D, SMAD4, SMARCA4, STK11 and TP53 (sequencing and deletion/duplication); AXIN2, HOXB13, MSH3, POLD1 and POLE (sequencing only); EPCAM and GREM1 (deletion/duplication only).    05/31/2023 Cancer Staging   Staging form: Breast, AJCC 8th Edition - Pathologic: Stage IA (pT2, pN1a, cM0, G1, ER+, PR+, HER2-, Oncotype DX score: 20) - Signed by Serena Croissant, MD on 05/31/2023 Multigene  prognostic tests performed: Oncotype DX Recurrence score range: Greater than or equal to 11 Histologic grading system: 3 grade system     CHIEF COMPLIANT: F/U after surgery  HISTORY OF PRESENT ILLNESS:   History of Present Illness   The patient, with a recent history of breast cancer, presents for a post-operative follow-up. She underwent surgery, which revealed a lymph node with cancer. Despite this, her oncotype score came back as low risk, indicating no benefit from chemotherapy. This is a relief for the patient, who is thankful for the advancements in understanding of cancer biology that spared her from chemotherapy.  This is meant to prevent any microscopic disease from recurring. The patient has not yet started radiation therapy.  The patient is also in the perimenopausal stage, with her last menstrual cycle a couple of months ago. The patient is aware of the side effects of tamoxifen, including hot flashes, mood swings, muscle cramps, a small risk of blood clots, and potential thickening of the lining of the uterus.         ALLERGIES:  has no known allergies.  MEDICATIONS:  Current Outpatient Medications  Medication Sig Dispense Refill   amphetamine-dextroamphetamine (ADDERALL) 20 MG tablet Take 20 mg by mouth in the morning and at bedtime.     betamethasone dipropionate 0.05 % cream Apply 1 Application topically daily as needed (skin irritation/psoriasis).     Cholecalciferol (VITAMIN D3) 50 MCG (2000 UT) TABS Take 2,000 Units by mouth in the morning.     famotidine (PEPCID) 20 MG tablet Take 20 mg by mouth in the morning.     loratadine (CLARITIN) 10 MG tablet Take 10 mg by mouth in the morning.     metoprolol tartrate (LOPRESSOR) 25 MG  tablet Take 1 tablet (25 mg total) by mouth once as needed for up to 1 dose. Take this tab as needed if you feel palpitations and your heart rate is elevated 15 tablet 0   montelukast (SINGULAIR) 10 MG tablet Take 10 mg by mouth in the morning.   3   Probiotic Product (PROBIOTIC PO) Take 1 capsule by mouth in the morning.     Tapinarof (VTAMA EX) Apply 1 Application topically daily.     VENTOLIN HFA 108 (90 BASE) MCG/ACT inhaler Inhale 2 puffs into the lungs every 4 (four) hours as needed for wheezing.     No current facility-administered medications for this visit.    PHYSICAL EXAMINATION: ECOG PERFORMANCE STATUS: 1 - Symptomatic but completely ambulatory  Vitals:   05/31/23 1504  BP: (!) 143/82  Pulse: 77  Resp: 18  Temp: 97.6 F (36.4 C)  SpO2: 100%   Filed Weights   05/31/23 1504  Weight: 145 lb 6.4 oz (66 kg)      LABORATORY DATA:  I have reviewed the data as listed    Latest Ref Rng & Units 04/20/2023   12:05 PM 01/27/2023    1:18 PM 10/01/2021    7:33 AM  CMP  Glucose 70 - 99 mg/dL 95  91  161   BUN 6 - 20 mg/dL 21  14  12    Creatinine 0.44 - 1.00 mg/dL 0.96  0.45  4.09   Sodium 135 - 145 mmol/L 137  136  139   Potassium 3.5 - 5.1 mmol/L 4.0  3.6  4.2   Chloride 98 - 111 mmol/L 102  104  102   CO2 22 - 32 mmol/L 31  21  24    Calcium 8.9 - 10.3 mg/dL 9.4  8.8  81.1   Total Protein 6.5 - 8.1 g/dL 7.0     Total Bilirubin <1.2 mg/dL 0.5     Alkaline Phos 38 - 126 U/L 51     AST 15 - 41 U/L 17     ALT 0 - 44 U/L 19       Lab Results  Component Value Date   WBC 7.0 04/20/2023   HGB 13.7 04/20/2023   HCT 39.5 04/20/2023   MCV 91.6 04/20/2023   PLT 377 04/20/2023   NEUTROABS 3.6 04/20/2023    ASSESSMENT & PLAN:  Malignant neoplasm of upper-outer quadrant of left breast in female, estrogen receptor positive (HCC) 04/08/2023:Screening mammogram detected left breast 2 masses total measuring 2.6 cm (1.5 cm and 1.1 cm) biopsy revealed grade 1 IDC ER 100% PR 100% HER2 negative, Ki-67 5%  05/04/2023: Left lumpectomy: Grade 1 IDC 2.1 cm with DCIS, margins negative, LVI not identified, 1/2 lymph nodes positive with extracapsular extension, ER 60%, PR 70%, HER2 negative, Ki-67 5% Oncotype DX recurrence score:  20  Pathology counseling: I discussed the final pathology report of the patient provided  a copy of this report. I discussed the margins as well as lymph node surgeries. We also discussed the final staging along with previously performed ER/PR and HER-2/neu testing.  Treatment plan: Oncotype DX: 20 (no role of chemotherapy) Adjuvant radiation therapy Adjuvant antiestrogen therapy  Return to clinic after radiation is completed ------------------------------------- Assessment and Plan    Breast Cancer (Stage 1A) Post-surgical patient with lymph node involvement. Oncotype score indicates low risk, sparing the patient from chemotherapy. Comprehensive radiation therapy, including lymph nodes and breast, is planned to prevent recurrence of any microscopic disease. -Start radiation therapy after  the holidays.  Hormone Therapy Patient is in perimenopause with last menstrual cycle a couple of months ago. Discussed the biology and mechanism of action of Tamoxifen and Anastrozole. -Start Tamoxifen for the first year, then switch to Anastrozole one year after stopping cycles (around September 2025). -Review potential side effects of Tamoxifen including hot flashes, mood swings, muscle cramps, risk of blood clots, and uterine lining thickening. -Consider Ribociclib addition to Anastrozole after one year, depending on patient's condition and risk factors.  Follow-up After completion of radiation therapy.          No orders of the defined types were placed in this encounter.  The patient has a good understanding of the overall plan. she agrees with it. she will call with any problems that may develop before the next visit here. Total time spent: 30 mins including face to face time and time spent for planning, charting and co-ordination of care   Tamsen Meek, MD 05/31/23

## 2023-05-31 NOTE — Assessment & Plan Note (Signed)
04/08/2023:Screening mammogram detected left breast 2 masses total measuring 2.6 cm (1.5 cm and 1.1 cm) biopsy revealed grade 1 IDC ER 100% PR 100% HER2 negative, Ki-67 5%  05/04/2023: Left lumpectomy: Grade 1 IDC 2.1 cm with DCIS, margins negative, LVI not identified, 1/2 lymph nodes positive with extracapsular extension, ER 60%, PR 70%, HER2 negative, Ki-67 5% Oncotype DX recurrence score: 20  Pathology counseling: I discussed the final pathology report of the patient provided  a copy of this report. I discussed the margins as well as lymph node surgeries. We also discussed the final staging along with previously performed ER/PR and HER-2/neu testing.  Treatment plan: Oncotype DX: 20 (no role of chemotherapy) Adjuvant radiation therapy Adjuvant antiestrogen therapy  Return to clinic after radiation is completed

## 2023-06-02 ENCOUNTER — Ambulatory Visit
Admission: RE | Admit: 2023-06-02 | Discharge: 2023-06-02 | Disposition: A | Discharge status: Home / Self Care | Payer: Managed Care, Other (non HMO) | Source: Ambulatory Visit | Attending: Radiation Oncology | Admitting: Radiation Oncology

## 2023-06-02 ENCOUNTER — Encounter: Payer: Self-pay | Admitting: Radiation Oncology

## 2023-06-02 VITALS — BP 139/91 | HR 68 | Temp 97.5°F | Resp 18 | Ht 65.0 in | Wt 145.0 lb

## 2023-06-02 DIAGNOSIS — F17210 Nicotine dependence, cigarettes, uncomplicated: Secondary | ICD-10-CM | POA: Insufficient documentation

## 2023-06-02 DIAGNOSIS — M1990 Unspecified osteoarthritis, unspecified site: Secondary | ICD-10-CM | POA: Diagnosis not present

## 2023-06-02 DIAGNOSIS — Z170 Estrogen receptor positive status [ER+]: Secondary | ICD-10-CM | POA: Diagnosis not present

## 2023-06-02 DIAGNOSIS — Z801 Family history of malignant neoplasm of trachea, bronchus and lung: Secondary | ICD-10-CM | POA: Diagnosis not present

## 2023-06-02 DIAGNOSIS — Z800 Family history of malignant neoplasm of digestive organs: Secondary | ICD-10-CM | POA: Diagnosis not present

## 2023-06-02 DIAGNOSIS — C50412 Malignant neoplasm of upper-outer quadrant of left female breast: Secondary | ICD-10-CM | POA: Insufficient documentation

## 2023-06-02 DIAGNOSIS — Z79899 Other long term (current) drug therapy: Secondary | ICD-10-CM | POA: Diagnosis not present

## 2023-06-02 DIAGNOSIS — F909 Attention-deficit hyperactivity disorder, unspecified type: Secondary | ICD-10-CM | POA: Diagnosis not present

## 2023-06-02 NOTE — Progress Notes (Addendum)
Nursing interview for Malignant neoplasm of upper-outer quadrant of left breast in female, estrogen receptor positive (HCC).   Patient identity verified x2.  Patient reports central chest rash w/ itching, steroid cream helps mildly. Patient denies chest pain, SOB and any other related issues at this time.  Meaningful use complete.  Vitals- BP (!) 139/91 (BP Location: Right Arm, Patient Position: Sitting)   Pulse 68   Temp (!) 97.5 F (36.4 C) (Temporal)   Resp 18   Ht 5\' 5"  (1.651 m)   Wt 145 lb (65.8 kg)   LMP 03/09/2023 (Approximate)   SpO2 98%   BMI 24.13 kg/m    This concludes the interaction.  Ruel Favors, LPN

## 2023-06-02 NOTE — Progress Notes (Signed)
Radiation Oncology         [phone] ________________________________  Name: Heidi Nolan        MRN: [id number]  Date of Service: 06/02/2023 DOB: 07-Feb-1971  WU:[id number], Resa Miner, FNP  Serena Croissant, MD     REFERRING PHYSICIAN: Serena Croissant, MD   DIAGNOSIS: The encounter diagnosis was Malignant neoplasm of upper-outer quadrant of left breast in female, estrogen receptor positive (HCC).   HISTORY OF PRESENT ILLNESS: Heidi Nolan is a 52 y.o. female seen in the multidisciplinary breast clinic for a new diagnosis of left breast cancer. The patient was noted to have screening detected left breast mass.  Further workup with diagnostic imaging on 04/06/2023 showed a mass in the 1 o'clock position with associated calcifications by mammography, the mass in the 1 o'clock position 7 cm from the nipple measuring 1.5 cm.  The associated area in the 1 o'clock position adjacent to the other measuring 1.1 cm, the total span 2.6 cm.  Her axilla was negative for adenopathy.  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    Since her last visit the patient underwent a Left lumpectomy with Dr. Dwain Sarna on 05/04/2023. This showed a grade 1 invasive ductal carcinoma measuring 2.1 cm with associated DCIS.  All of her margins were negative for invasive carcinoma, the closest being the posterior margin at 1 mm, her closest DCIS margin was 0.1 mm to the posterior margin but were otherwise negative, she had additional lateral and superior margins that were negative for invasive or in situ disease, and 2 sentinel lymph nodes that were submitted, one contained metastatic disease.  She did have Oncotype DX score performed which was 20, and Dr. Pamelia Hoit met with her on 05/31/2023 and discussed that there was no role for chemotherapy.  His plan is  antiestrogen therapy after she completes adjuvant radiotherapy to the left breast and regional lymph nodes.  She is seen today to discuss this and to plan treatment.    PREVIOUS RADIATION THERAPY: No   PAST MEDICAL HISTORY:  Past Medical History:  Diagnosis Date   Abnormal Pap smear of cervix    ADHD (attention deficit hyperactivity disorder)    Arthritis    hips and back   Cancer of left breast (HCC)    Cervical disc disease    SVT (supraventricular tachycardia) (HCC)        PAST SURGICAL HISTORY: Past Surgical History:  Procedure Laterality Date   AXILLARY LYMPH NODE BIOPSY Left 05/04/2023   Procedure: LEFT AXILLARY SENTINEL LYMPH NODE BIOPSY;  Surgeon: Emelia Loron, MD;  Location: [facility] OR;  Service: General;  Laterality: Left;   BREAST BIOPSY Left 04/08/2023   [place] LT BREAST BX W LOC DEV 1ST LESION IMG BX SPEC US GUIDE 04/08/2023 [facility] MAMMOGRAPHY   BREAST BIOPSY  05/03/2023   MM LT RADIOACTIVE SEED LOC MAMMO GUIDE 05/03/2023 [facility] MAMMOGRAPHY   BREAST LUMPECTOMY WITH RADIOACTIVE SEED LOCALIZATION Left 05/04/2023   Procedure: LEFT BREAST SEED BRACKETED LUMPECTOMY;  Surgeon: Emelia Loron, MD;  Location: [facility] OR;  Service: General;  Laterality: Left;   CESAREAN SECTION  1991   NECK SURGERY  9/14   [facility]     FAMILY HISTORY:  Family History  Problem Relation Age of Onset   Osteoarthritis Mother    Heart attack Father 65  Died of MI   Colon cancer Maternal Aunt        dx >50   Colon cancer Maternal Aunt        dx <50   Throat cancer Paternal Uncle        dx >50; or lung?   Breast cancer Maternal Grandmother        dx 86s   Lung cancer Paternal Grandfather        dx >50     SOCIAL HISTORY:  reports that she has been smoking cigarettes. She has a 15 pack-year smoking history. She has never used smokeless tobacco. She reports current alcohol use of about 5.0 - 7.0 standard drinks of alcohol per week. She reports that she does not use drugs. The patient  is married and [place]  She is a Investment banker, operational and owns a [employer] She's accompanied by her husband.   ALLERGIES: Patient has no known allergies.   MEDICATIONS:  Current Outpatient Medications  Medication Sig Dispense Refill   amphetamine-dextroamphetamine (ADDERALL) 20 MG tablet Take 20 mg by mouth in the morning and at bedtime.     betamethasone dipropionate 0.05 % cream Apply 1 Application topically daily as needed (skin irritation/psoriasis).     Cholecalciferol (VITAMIN D3) 50 MCG (2000 UT) TABS Take 2,000 Units by mouth in the morning.     famotidine (PEPCID) 20 MG tablet Take 20 mg by mouth in the morning.     loratadine (CLARITIN) 10 MG tablet Take 10 mg by mouth in the morning.     metoprolol tartrate (LOPRESSOR) 25 MG tablet Take 1 tablet (25 mg total) by mouth once as needed for up to 1 dose. Take this tab as needed if you feel palpitations and your heart rate is elevated 15 tablet 0   montelukast (SINGULAIR) 10 MG tablet Take 10 mg by mouth in the morning.  3   Probiotic Product (PROBIOTIC PO) Take 1 capsule by mouth in the morning.     Tapinarof (VTAMA EX) Apply 1 Application topically daily.     VENTOLIN HFA 108 (90 BASE) MCG/ACT inhaler Inhale 2 puffs into the lungs every 4 (four) hours as needed for wheezing.     No current facility-administered medications for this encounter.     REVIEW OF SYSTEMS: On review of systems, the patient reports that she is doing well overall since her surgery and states that she has not been having any concerns related to her healing or course to date.  No other specific breast complaints are verbalized.    PHYSICAL EXAM:  Wt Readings from Last 3 Encounters:  06/02/23 145 lb (65.8 kg)  05/31/23 145 lb 6.4 oz (66 kg)  05/04/23 140 lb (63.5 kg)   Temp Readings from Last 3 Encounters:  06/02/23 (!) 97.5 F (36.4 C) (Temporal)  05/31/23 97.6 F (36.4 C) (Temporal)  05/04/23 (!) 97.4 F (36.3 C)   BP Readings  from Last 3 Encounters:  06/02/23 (!) 139/91  05/31/23 (!) 143/82  05/04/23 (!) 148/93   Pulse Readings from Last 3 Encounters:  06/02/23 68  05/31/23 77  05/04/23 71    In general this is a well appearing caucasian female in no acute distress. She's alert and oriented x4 and appropriate throughout the examination. Cardiopulmonary assessment is negative for acute distress and she exhibits normal effort.  She has a single incision in the upper outer breast on the left which is well-healed, there is no erythema, separation or  drainage.    ECOG = 1  0 - Asymptomatic (Fully active, able to carry on all predisease activities without restriction)  1 - Symptomatic but completely ambulatory (Restricted in physically strenuous activity but ambulatory and able to carry out work of a light or sedentary nature. For example, light housework, office work)  2 - Symptomatic, <50% in bed during the day (Ambulatory and capable of all self care but unable to carry out any work activities. Up and about more than 50% of waking hours)  3 - Symptomatic, >50% in bed, but not bedbound (Capable of only limited self-care, confined to bed or chair 50% or more of waking hours)  4 - Bedbound (Completely disabled. Cannot carry on any self-care. Totally confined to bed or chair)  5 - Death   Santiago Glad MM, Creech RH, Tormey DC, et al. [phone]). "Toxicity and response criteria of the [employer]". Am. Evlyn Clines. Oncol. 5 (6): 649-55    LABORATORY DATA:  Lab Results  Component Value Date   WBC 7.0 04/20/2023   HGB 13.7 04/20/2023   HCT 39.5 04/20/2023   MCV 91.6 04/20/2023   PLT 377 04/20/2023   Lab Results  Component Value Date   NA 137 04/20/2023   K 4.0 04/20/2023   CL 102 04/20/2023   CO2 31 04/20/2023   Lab Results  Component Value Date   ALT 19 04/20/2023   AST 17 04/20/2023   ALKPHOS 51 04/20/2023   BILITOT 0.5 04/20/2023      RADIOGRAPHY: MM Breast Surgical  Specimen Result Date: 05/04/2023 CLINICAL DATA:  Surgical specimen. EXAM: SPECIMEN RADIOGRAPH OF THE LEFT BREAST COMPARISON:  Previous exam(s). FINDINGS: Status post excision of the left breast. The radioactive seed and biopsy marker clip are present, completely intact, and were marked for pathology. IMPRESSION: Specimen radiograph of the left breast. Electronically Signed   By: Baird Lyons M.D.   On: 05/04/2023 09:26   MM LT RADIOACTIVE SEED LOC MAMMO GUIDE Result Date: 05/03/2023 CLINICAL DATA:  Patient presents for radioactive seed localization of the LEFT breast. EXAM: MAMMOGRAPHIC GUIDED RADIOACTIVE SEED LOCALIZATION OF THE LEFT BREAST COMPARISON:  Previous exam(s). FINDINGS: Patient presents for radioactive seed localization prior to lumpectomy. I met with the patient and we discussed the procedure of seed localization including benefits and alternatives. We discussed the high likelihood of a successful procedure. We discussed the risks of the procedure including infection, bleeding, tissue injury and further surgery. We discussed the low dose of radioactivity involved in the procedure. Informed, written consent was given. The usual time-out protocol was performed immediately prior to the procedure. Using mammographic guidance, sterile technique, 1% lidocaine and an I-125 radioactive seed, the ribbon shaped clip in the UPPER-OUTER QUADRANT of the LEFT breast was localized using a LATERAL approach. The follow-up mammogram images confirm the seed in the expected location and were marked for Dr. Dwain Sarna. Follow-up survey of the patient confirms presence of the radioactive seed. Order number of I-125 seed:  [id number]. Total activity:  0.260 millicuries reference Date: 03/30/2023 The patient tolerated the procedure well and was released from the [facility]. She was given instructions regarding seed removal. IMPRESSION: Radioactive seed localization of the LEFT breast. No apparent complications.  Electronically Signed   By: Norva Pavlov M.D.   On: 05/03/2023 11:26       IMPRESSION/PLAN: 1. Stage IA, pT2N1M0, grade 1, ER/PR positive invasive ductal carcinoma of the left breast. Dr. Mitzi Hansen has reviewed her final pathology findings and today she  and I discussed the nature of early stage left breast disease.  She has done well since surgery.  Her Oncotype DX score risk Dr. Pamelia Hoit does not recommend systemic chemotherapy but plans adjuvant antiestrogen treatment.  Dr. Mitzi Hansen would also recommend external radiotherapy to the breast as well as to the regional lymph nodes given her single node metastasis noted with final pathology. We discussed the risks, benefits, short, and long term effects of radiotherapy, as well as the curative intent, and the patient is interested in proceeding.  I reviewed the delivery and logistics of radiotherapy and Dr. Mitzi Hansen recommends 6 1/2 weeks of radiotherapy to the left breast with deep inspiration breath-hold technique. Written consent is obtained and placed in the chart, a copy was provided to the patient.  She will simulate today.   In a visit lasting 45 minutes, greater than 50% of the time was spent face to face reviewing her case, as well as in preparation of, discussing, and coordinating the patient's care.    Osker Mason, [facility]    **Disclaimer: This note was dictated with voice recognition software. Similar sounding words can inadvertently be transcribed and this note may contain transcription errors which may not have been corrected upon publication of note.**

## 2023-06-06 ENCOUNTER — Ambulatory Visit: Payer: Managed Care, Other (non HMO)

## 2023-06-09 DIAGNOSIS — C50412 Malignant neoplasm of upper-outer quadrant of left female breast: Secondary | ICD-10-CM | POA: Diagnosis present

## 2023-06-09 DIAGNOSIS — Z170 Estrogen receptor positive status [ER+]: Secondary | ICD-10-CM | POA: Diagnosis not present

## 2023-06-10 ENCOUNTER — Ambulatory Visit (HOSPITAL_BASED_OUTPATIENT_CLINIC_OR_DEPARTMENT_OTHER): Admit: 2023-06-10 | Payer: Managed Care, Other (non HMO) | Admitting: Obstetrics and Gynecology

## 2023-06-10 ENCOUNTER — Encounter (HOSPITAL_BASED_OUTPATIENT_CLINIC_OR_DEPARTMENT_OTHER): Payer: Self-pay

## 2023-06-10 DIAGNOSIS — D250 Submucous leiomyoma of uterus: Secondary | ICD-10-CM

## 2023-06-10 SURGERY — DILATATION & CURETTAGE/HYSTEROSCOPY WITH MYOSURE
Anesthesia: Choice

## 2023-06-13 ENCOUNTER — Encounter: Payer: Self-pay | Admitting: *Deleted

## 2023-06-13 DIAGNOSIS — Z170 Estrogen receptor positive status [ER+]: Secondary | ICD-10-CM

## 2023-06-16 ENCOUNTER — Telehealth: Payer: Self-pay | Admitting: Hematology and Oncology

## 2023-06-16 ENCOUNTER — Other Ambulatory Visit: Payer: Self-pay

## 2023-06-16 ENCOUNTER — Ambulatory Visit: Payer: Managed Care, Other (non HMO) | Admitting: Radiation Oncology

## 2023-06-16 ENCOUNTER — Ambulatory Visit
Admission: RE | Admit: 2023-06-16 | Discharge: 2023-06-16 | Disposition: A | Discharge status: Home / Self Care | Payer: Managed Care, Other (non HMO) | Source: Ambulatory Visit | Attending: Radiation Oncology | Admitting: Radiation Oncology

## 2023-06-16 DIAGNOSIS — Z170 Estrogen receptor positive status [ER+]: Secondary | ICD-10-CM | POA: Diagnosis present

## 2023-06-16 DIAGNOSIS — C50412 Malignant neoplasm of upper-outer quadrant of left female breast: Secondary | ICD-10-CM | POA: Diagnosis present

## 2023-06-16 LAB — RAD ONC ARIA SESSION SUMMARY

## 2023-06-16 NOTE — Telephone Encounter (Signed)
 Marland Kitchen

## 2023-06-17 ENCOUNTER — Ambulatory Visit
Admission: RE | Admit: 2023-06-17 | Discharge: 2023-06-17 | Disposition: A | Discharge status: Home / Self Care | Payer: Managed Care, Other (non HMO) | Source: Ambulatory Visit | Attending: Radiation Oncology

## 2023-06-17 ENCOUNTER — Other Ambulatory Visit: Payer: Self-pay

## 2023-06-17 ENCOUNTER — Ambulatory Visit: Payer: Managed Care, Other (non HMO)

## 2023-06-17 DIAGNOSIS — C50412 Malignant neoplasm of upper-outer quadrant of left female breast: Secondary | ICD-10-CM | POA: Diagnosis not present

## 2023-06-17 LAB — RAD ONC ARIA SESSION SUMMARY

## 2023-06-20 ENCOUNTER — Ambulatory Visit
Admission: RE | Admit: 2023-06-20 | Discharge: 2023-06-20 | Disposition: A | Discharge status: Home / Self Care | Payer: Managed Care, Other (non HMO) | Source: Ambulatory Visit | Attending: Radiation Oncology | Admitting: Radiation Oncology

## 2023-06-20 ENCOUNTER — Other Ambulatory Visit: Payer: Self-pay

## 2023-06-20 ENCOUNTER — Ambulatory Visit: Payer: Managed Care, Other (non HMO)

## 2023-06-20 DIAGNOSIS — C50412 Malignant neoplasm of upper-outer quadrant of left female breast: Secondary | ICD-10-CM | POA: Diagnosis not present

## 2023-06-20 LAB — RAD ONC ARIA SESSION SUMMARY
Course Elapsed Days: 4
Plan Fractions Treated to Date: 3
Plan Fractions Treated to Date: 3
Plan Prescribed Dose Per Fraction: 1.8 Gy
Plan Prescribed Dose Per Fraction: 1.8 Gy
Plan Total Fractions Prescribed: 28
Plan Total Fractions Prescribed: 28
Plan Total Prescribed Dose: 50.4 Gy
Plan Total Prescribed Dose: 50.4 Gy
Reference Point Dosage Given to Date: 5.4 Gy
Reference Point Dosage Given to Date: 5.4 Gy
Reference Point Session Dosage Given: 1.8 Gy
Reference Point Session Dosage Given: 1.8 Gy
Session Number: 3

## 2023-06-21 ENCOUNTER — Ambulatory Visit: Payer: Managed Care, Other (non HMO)

## 2023-06-21 ENCOUNTER — Other Ambulatory Visit: Payer: Self-pay

## 2023-06-21 ENCOUNTER — Ambulatory Visit
Admission: RE | Admit: 2023-06-21 | Discharge: 2023-06-21 | Disposition: A | Discharge status: Home / Self Care | Payer: Managed Care, Other (non HMO) | Source: Ambulatory Visit | Attending: Radiation Oncology | Admitting: Radiation Oncology

## 2023-06-21 DIAGNOSIS — C50412 Malignant neoplasm of upper-outer quadrant of left female breast: Secondary | ICD-10-CM | POA: Diagnosis not present

## 2023-06-21 DIAGNOSIS — Z170 Estrogen receptor positive status [ER+]: Secondary | ICD-10-CM

## 2023-06-21 LAB — RAD ONC ARIA SESSION SUMMARY

## 2023-06-21 MED ORDER — ALRA NON-METALLIC DEODORANT (RAD-ONC)
1.0000 | Freq: Once | TOPICAL | Status: AC
  Administered 2023-06-21: 1 via TOPICAL

## 2023-06-21 MED ORDER — RADIAPLEXRX EX GEL
Freq: Once | CUTANEOUS | Status: AC
Start: 2023-06-21 — End: 2023-06-21

## 2023-06-22 ENCOUNTER — Ambulatory Visit: Payer: Managed Care, Other (non HMO)

## 2023-06-22 ENCOUNTER — Other Ambulatory Visit: Payer: Self-pay

## 2023-06-22 ENCOUNTER — Ambulatory Visit
Admission: RE | Admit: 2023-06-22 | Discharge: 2023-06-22 | Disposition: A | Discharge status: Home / Self Care | Payer: Managed Care, Other (non HMO) | Source: Ambulatory Visit | Attending: Radiation Oncology | Admitting: Radiation Oncology

## 2023-06-22 DIAGNOSIS — C50412 Malignant neoplasm of upper-outer quadrant of left female breast: Secondary | ICD-10-CM | POA: Diagnosis not present

## 2023-06-22 LAB — RAD ONC ARIA SESSION SUMMARY

## 2023-06-23 ENCOUNTER — Ambulatory Visit: Payer: Managed Care, Other (non HMO)

## 2023-06-24 ENCOUNTER — Other Ambulatory Visit: Payer: Self-pay

## 2023-06-24 ENCOUNTER — Ambulatory Visit
Admission: RE | Admit: 2023-06-24 | Discharge: 2023-06-24 | Disposition: A | Discharge status: Home / Self Care | Payer: Managed Care, Other (non HMO) | Source: Ambulatory Visit | Attending: Radiation Oncology | Admitting: Radiation Oncology

## 2023-06-24 ENCOUNTER — Ambulatory Visit: Payer: Managed Care, Other (non HMO)

## 2023-06-24 ENCOUNTER — Ambulatory Visit
Admission: RE | Admit: 2023-06-24 | Discharge: 2023-06-24 | Disposition: A | Discharge status: Home / Self Care | Payer: Managed Care, Other (non HMO) | Source: Ambulatory Visit | Attending: Radiation Oncology

## 2023-06-24 DIAGNOSIS — C50412 Malignant neoplasm of upper-outer quadrant of left female breast: Secondary | ICD-10-CM | POA: Diagnosis not present

## 2023-06-24 LAB — RAD ONC ARIA SESSION SUMMARY

## 2023-06-27 ENCOUNTER — Ambulatory Visit: Payer: Managed Care, Other (non HMO)

## 2023-06-27 ENCOUNTER — Other Ambulatory Visit: Payer: Self-pay

## 2023-06-27 ENCOUNTER — Ambulatory Visit
Admission: RE | Admit: 2023-06-27 | Discharge: 2023-06-27 | Disposition: A | Discharge status: Home / Self Care | Payer: Managed Care, Other (non HMO) | Source: Ambulatory Visit | Attending: Radiation Oncology | Admitting: Radiation Oncology

## 2023-06-27 DIAGNOSIS — C50412 Malignant neoplasm of upper-outer quadrant of left female breast: Secondary | ICD-10-CM | POA: Diagnosis not present

## 2023-06-27 LAB — RAD ONC ARIA SESSION SUMMARY
Course Elapsed Days: 11
Plan Fractions Treated to Date: 7
Plan Fractions Treated to Date: 7
Plan Prescribed Dose Per Fraction: 1.8 Gy
Plan Prescribed Dose Per Fraction: 1.8 Gy
Plan Total Fractions Prescribed: 28
Plan Total Fractions Prescribed: 28
Plan Total Prescribed Dose: 50.4 Gy
Plan Total Prescribed Dose: 50.4 Gy
Reference Point Dosage Given to Date: 12.6 Gy
Reference Point Dosage Given to Date: 12.6 Gy
Reference Point Session Dosage Given: 1.8 Gy
Reference Point Session Dosage Given: 1.8 Gy
Session Number: 7

## 2023-06-28 ENCOUNTER — Other Ambulatory Visit: Payer: Self-pay

## 2023-06-28 ENCOUNTER — Ambulatory Visit: Payer: Managed Care, Other (non HMO)

## 2023-06-28 ENCOUNTER — Ambulatory Visit
Admission: RE | Admit: 2023-06-28 | Discharge: 2023-06-28 | Disposition: A | Discharge status: Home / Self Care | Payer: Managed Care, Other (non HMO) | Source: Ambulatory Visit | Attending: Radiation Oncology

## 2023-06-28 DIAGNOSIS — C50412 Malignant neoplasm of upper-outer quadrant of left female breast: Secondary | ICD-10-CM | POA: Diagnosis not present

## 2023-06-28 LAB — RAD ONC ARIA SESSION SUMMARY
Course Elapsed Days: 12
Plan Fractions Treated to Date: 8
Plan Fractions Treated to Date: 8
Plan Prescribed Dose Per Fraction: 1.8 Gy
Plan Prescribed Dose Per Fraction: 1.8 Gy
Plan Total Fractions Prescribed: 28
Plan Total Fractions Prescribed: 28
Plan Total Prescribed Dose: 50.4 Gy
Plan Total Prescribed Dose: 50.4 Gy
Reference Point Dosage Given to Date: 14.4 Gy
Reference Point Dosage Given to Date: 14.4 Gy
Reference Point Session Dosage Given: 1.8 Gy
Reference Point Session Dosage Given: 1.8 Gy
Session Number: 8

## 2023-06-29 ENCOUNTER — Ambulatory Visit
Admission: RE | Admit: 2023-06-29 | Discharge: 2023-06-29 | Disposition: A | Discharge status: Home / Self Care | Payer: Managed Care, Other (non HMO) | Source: Ambulatory Visit | Attending: Radiation Oncology | Admitting: Radiation Oncology

## 2023-06-29 ENCOUNTER — Other Ambulatory Visit: Payer: Self-pay

## 2023-06-29 ENCOUNTER — Ambulatory Visit: Payer: Managed Care, Other (non HMO)

## 2023-06-29 DIAGNOSIS — C50412 Malignant neoplasm of upper-outer quadrant of left female breast: Secondary | ICD-10-CM | POA: Diagnosis not present

## 2023-06-29 LAB — RAD ONC ARIA SESSION SUMMARY
Course Elapsed Days: 13
Plan Fractions Treated to Date: 9
Plan Fractions Treated to Date: 9
Plan Prescribed Dose Per Fraction: 1.8 Gy
Plan Prescribed Dose Per Fraction: 1.8 Gy
Plan Total Fractions Prescribed: 28
Plan Total Fractions Prescribed: 28
Plan Total Prescribed Dose: 50.4 Gy
Plan Total Prescribed Dose: 50.4 Gy
Reference Point Dosage Given to Date: 16.2 Gy
Reference Point Dosage Given to Date: 16.2 Gy
Reference Point Session Dosage Given: 1.8 Gy
Reference Point Session Dosage Given: 1.8 Gy
Session Number: 9

## 2023-06-30 ENCOUNTER — Other Ambulatory Visit: Payer: Self-pay

## 2023-06-30 ENCOUNTER — Ambulatory Visit
Admission: RE | Admit: 2023-06-30 | Discharge: 2023-06-30 | Disposition: A | Discharge status: Home / Self Care | Payer: Managed Care, Other (non HMO) | Source: Ambulatory Visit | Attending: Radiation Oncology | Admitting: Radiation Oncology

## 2023-06-30 ENCOUNTER — Ambulatory Visit: Payer: Managed Care, Other (non HMO)

## 2023-06-30 DIAGNOSIS — C50412 Malignant neoplasm of upper-outer quadrant of left female breast: Secondary | ICD-10-CM | POA: Diagnosis not present

## 2023-06-30 LAB — RAD ONC ARIA SESSION SUMMARY
Course Elapsed Days: 14
Plan Fractions Treated to Date: 10
Plan Fractions Treated to Date: 10
Plan Prescribed Dose Per Fraction: 1.8 Gy
Plan Prescribed Dose Per Fraction: 1.8 Gy
Plan Total Fractions Prescribed: 28
Plan Total Fractions Prescribed: 28
Plan Total Prescribed Dose: 50.4 Gy
Plan Total Prescribed Dose: 50.4 Gy
Reference Point Dosage Given to Date: 18 Gy
Reference Point Dosage Given to Date: 18 Gy
Reference Point Session Dosage Given: 1.8 Gy
Reference Point Session Dosage Given: 1.8 Gy
Session Number: 10

## 2023-07-01 ENCOUNTER — Ambulatory Visit
Admission: RE | Admit: 2023-07-01 | Discharge: 2023-07-01 | Disposition: A | Discharge status: Home / Self Care | Payer: Managed Care, Other (non HMO) | Source: Ambulatory Visit | Attending: Radiation Oncology | Admitting: Radiation Oncology

## 2023-07-01 ENCOUNTER — Ambulatory Visit: Payer: Managed Care, Other (non HMO)

## 2023-07-01 ENCOUNTER — Other Ambulatory Visit: Payer: Self-pay

## 2023-07-01 DIAGNOSIS — C50412 Malignant neoplasm of upper-outer quadrant of left female breast: Secondary | ICD-10-CM | POA: Diagnosis not present

## 2023-07-01 LAB — RAD ONC ARIA SESSION SUMMARY

## 2023-07-04 ENCOUNTER — Ambulatory Visit: Payer: Managed Care, Other (non HMO)

## 2023-07-04 ENCOUNTER — Other Ambulatory Visit: Payer: Self-pay

## 2023-07-04 ENCOUNTER — Ambulatory Visit
Admission: RE | Admit: 2023-07-04 | Discharge: 2023-07-04 | Disposition: A | Discharge status: Home / Self Care | Payer: Managed Care, Other (non HMO) | Source: Ambulatory Visit | Attending: Radiation Oncology | Admitting: Radiation Oncology

## 2023-07-04 DIAGNOSIS — C50412 Malignant neoplasm of upper-outer quadrant of left female breast: Secondary | ICD-10-CM | POA: Diagnosis not present

## 2023-07-04 LAB — RAD ONC ARIA SESSION SUMMARY
Course Elapsed Days: 18
Plan Fractions Treated to Date: 12
Plan Fractions Treated to Date: 12
Plan Prescribed Dose Per Fraction: 1.8 Gy
Plan Prescribed Dose Per Fraction: 1.8 Gy
Plan Total Fractions Prescribed: 28
Plan Total Fractions Prescribed: 28
Plan Total Prescribed Dose: 50.4 Gy
Plan Total Prescribed Dose: 50.4 Gy
Reference Point Dosage Given to Date: 21.6 Gy
Reference Point Dosage Given to Date: 21.6 Gy
Reference Point Session Dosage Given: 1.8 Gy
Reference Point Session Dosage Given: 1.8 Gy
Session Number: 12

## 2023-07-05 ENCOUNTER — Other Ambulatory Visit: Payer: Self-pay

## 2023-07-05 ENCOUNTER — Ambulatory Visit
Admission: RE | Admit: 2023-07-05 | Discharge: 2023-07-05 | Disposition: A | Discharge status: Home / Self Care | Payer: Managed Care, Other (non HMO) | Source: Ambulatory Visit | Attending: Radiation Oncology | Admitting: Radiation Oncology

## 2023-07-05 ENCOUNTER — Ambulatory Visit: Payer: Managed Care, Other (non HMO)

## 2023-07-05 DIAGNOSIS — C50412 Malignant neoplasm of upper-outer quadrant of left female breast: Secondary | ICD-10-CM | POA: Diagnosis not present

## 2023-07-05 LAB — RAD ONC ARIA SESSION SUMMARY
Course Elapsed Days: 19
Plan Fractions Treated to Date: 13
Plan Fractions Treated to Date: 13
Plan Prescribed Dose Per Fraction: 1.8 Gy
Plan Prescribed Dose Per Fraction: 1.8 Gy
Plan Total Fractions Prescribed: 28
Plan Total Fractions Prescribed: 28
Plan Total Prescribed Dose: 50.4 Gy
Plan Total Prescribed Dose: 50.4 Gy
Reference Point Dosage Given to Date: 23.4 Gy
Reference Point Dosage Given to Date: 23.4 Gy
Reference Point Session Dosage Given: 1.8 Gy
Reference Point Session Dosage Given: 1.8 Gy
Session Number: 13

## 2023-07-06 ENCOUNTER — Other Ambulatory Visit: Payer: Self-pay

## 2023-07-06 ENCOUNTER — Ambulatory Visit: Payer: Managed Care, Other (non HMO)

## 2023-07-06 ENCOUNTER — Ambulatory Visit
Admission: RE | Admit: 2023-07-06 | Discharge: 2023-07-06 | Disposition: A | Discharge status: Home / Self Care | Payer: Managed Care, Other (non HMO) | Source: Ambulatory Visit | Attending: Radiation Oncology

## 2023-07-06 DIAGNOSIS — C50412 Malignant neoplasm of upper-outer quadrant of left female breast: Secondary | ICD-10-CM | POA: Diagnosis not present

## 2023-07-06 LAB — RAD ONC ARIA SESSION SUMMARY
Course Elapsed Days: 20
Plan Fractions Treated to Date: 14
Plan Fractions Treated to Date: 14
Plan Prescribed Dose Per Fraction: 1.8 Gy
Plan Prescribed Dose Per Fraction: 1.8 Gy
Plan Total Fractions Prescribed: 28
Plan Total Fractions Prescribed: 28
Plan Total Prescribed Dose: 50.4 Gy
Plan Total Prescribed Dose: 50.4 Gy
Reference Point Dosage Given to Date: 25.2 Gy
Reference Point Dosage Given to Date: 25.2 Gy
Reference Point Session Dosage Given: 1.8 Gy
Reference Point Session Dosage Given: 1.8 Gy
Session Number: 14

## 2023-07-07 ENCOUNTER — Ambulatory Visit: Payer: Managed Care, Other (non HMO)

## 2023-07-07 ENCOUNTER — Other Ambulatory Visit: Payer: Self-pay

## 2023-07-07 ENCOUNTER — Ambulatory Visit
Admission: RE | Admit: 2023-07-07 | Discharge: 2023-07-07 | Disposition: A | Discharge status: Home / Self Care | Payer: Managed Care, Other (non HMO) | Source: Ambulatory Visit | Attending: Radiation Oncology | Admitting: Radiation Oncology

## 2023-07-07 DIAGNOSIS — C50412 Malignant neoplasm of upper-outer quadrant of left female breast: Secondary | ICD-10-CM | POA: Diagnosis not present

## 2023-07-07 LAB — RAD ONC ARIA SESSION SUMMARY

## 2023-07-08 ENCOUNTER — Ambulatory Visit: Payer: Managed Care, Other (non HMO) | Admitting: Radiation Oncology

## 2023-07-08 ENCOUNTER — Ambulatory Visit: Payer: Managed Care, Other (non HMO)

## 2023-07-11 ENCOUNTER — Other Ambulatory Visit: Payer: Self-pay

## 2023-07-11 ENCOUNTER — Ambulatory Visit: Payer: Managed Care, Other (non HMO)

## 2023-07-11 ENCOUNTER — Ambulatory Visit
Admission: RE | Admit: 2023-07-11 | Discharge: 2023-07-11 | Disposition: A | Discharge status: Home / Self Care | Payer: Managed Care, Other (non HMO) | Source: Ambulatory Visit | Attending: Radiation Oncology | Admitting: Radiation Oncology

## 2023-07-11 DIAGNOSIS — C50412 Malignant neoplasm of upper-outer quadrant of left female breast: Secondary | ICD-10-CM | POA: Diagnosis not present

## 2023-07-11 LAB — RAD ONC ARIA SESSION SUMMARY
Course Elapsed Days: 25
Plan Fractions Treated to Date: 16
Plan Fractions Treated to Date: 16
Plan Prescribed Dose Per Fraction: 1.8 Gy
Plan Prescribed Dose Per Fraction: 1.8 Gy
Plan Total Fractions Prescribed: 28
Plan Total Fractions Prescribed: 28
Plan Total Prescribed Dose: 50.4 Gy
Plan Total Prescribed Dose: 50.4 Gy
Reference Point Dosage Given to Date: 28.8 Gy
Reference Point Dosage Given to Date: 28.8 Gy
Reference Point Session Dosage Given: 1.8 Gy
Reference Point Session Dosage Given: 1.8 Gy
Session Number: 16

## 2023-07-12 ENCOUNTER — Other Ambulatory Visit: Payer: Self-pay

## 2023-07-12 ENCOUNTER — Ambulatory Visit: Payer: Managed Care, Other (non HMO)

## 2023-07-12 ENCOUNTER — Ambulatory Visit
Admission: RE | Admit: 2023-07-12 | Discharge: 2023-07-12 | Disposition: A | Discharge status: Home / Self Care | Payer: Managed Care, Other (non HMO) | Source: Ambulatory Visit | Attending: Radiation Oncology | Admitting: Radiation Oncology

## 2023-07-12 DIAGNOSIS — C50412 Malignant neoplasm of upper-outer quadrant of left female breast: Secondary | ICD-10-CM | POA: Diagnosis not present

## 2023-07-12 LAB — RAD ONC ARIA SESSION SUMMARY
Course Elapsed Days: 26
Plan Fractions Treated to Date: 17
Plan Fractions Treated to Date: 17
Plan Prescribed Dose Per Fraction: 1.8 Gy
Plan Prescribed Dose Per Fraction: 1.8 Gy
Plan Total Fractions Prescribed: 28
Plan Total Fractions Prescribed: 28
Plan Total Prescribed Dose: 50.4 Gy
Plan Total Prescribed Dose: 50.4 Gy
Reference Point Dosage Given to Date: 30.6 Gy
Reference Point Dosage Given to Date: 30.6 Gy
Reference Point Session Dosage Given: 1.8 Gy
Reference Point Session Dosage Given: 1.8 Gy
Session Number: 17

## 2023-07-13 ENCOUNTER — Ambulatory Visit: Payer: Managed Care, Other (non HMO)

## 2023-07-13 ENCOUNTER — Ambulatory Visit
Admission: RE | Admit: 2023-07-13 | Discharge: 2023-07-13 | Disposition: A | Discharge status: Home / Self Care | Payer: Managed Care, Other (non HMO) | Source: Ambulatory Visit | Attending: Radiation Oncology

## 2023-07-13 ENCOUNTER — Other Ambulatory Visit: Payer: Self-pay

## 2023-07-13 DIAGNOSIS — C50412 Malignant neoplasm of upper-outer quadrant of left female breast: Secondary | ICD-10-CM | POA: Diagnosis not present

## 2023-07-13 LAB — RAD ONC ARIA SESSION SUMMARY
Course Elapsed Days: 27
Plan Fractions Treated to Date: 18
Plan Fractions Treated to Date: 18
Plan Prescribed Dose Per Fraction: 1.8 Gy
Plan Prescribed Dose Per Fraction: 1.8 Gy
Plan Total Fractions Prescribed: 28
Plan Total Fractions Prescribed: 28
Plan Total Prescribed Dose: 50.4 Gy
Plan Total Prescribed Dose: 50.4 Gy
Reference Point Dosage Given to Date: 32.4 Gy
Reference Point Dosage Given to Date: 32.4 Gy
Reference Point Session Dosage Given: 1.8 Gy
Reference Point Session Dosage Given: 1.8 Gy
Session Number: 18

## 2023-07-14 ENCOUNTER — Ambulatory Visit
Admission: RE | Admit: 2023-07-14 | Discharge: 2023-07-14 | Disposition: A | Discharge status: Home / Self Care | Payer: Managed Care, Other (non HMO) | Source: Ambulatory Visit | Attending: Radiation Oncology | Admitting: Radiation Oncology

## 2023-07-14 ENCOUNTER — Other Ambulatory Visit: Payer: Self-pay

## 2023-07-14 DIAGNOSIS — C50412 Malignant neoplasm of upper-outer quadrant of left female breast: Secondary | ICD-10-CM | POA: Diagnosis not present

## 2023-07-14 LAB — RAD ONC ARIA SESSION SUMMARY
Course Elapsed Days: 28
Plan Fractions Treated to Date: 19
Plan Fractions Treated to Date: 19
Plan Prescribed Dose Per Fraction: 1.8 Gy
Plan Prescribed Dose Per Fraction: 1.8 Gy
Plan Total Fractions Prescribed: 28
Plan Total Fractions Prescribed: 28
Plan Total Prescribed Dose: 50.4 Gy
Plan Total Prescribed Dose: 50.4 Gy
Reference Point Dosage Given to Date: 34.2 Gy
Reference Point Dosage Given to Date: 34.2 Gy
Reference Point Session Dosage Given: 1.8 Gy
Reference Point Session Dosage Given: 1.8 Gy
Session Number: 19

## 2023-07-15 ENCOUNTER — Ambulatory Visit
Admission: RE | Admit: 2023-07-15 | Discharge: 2023-07-15 | Disposition: A | Discharge status: Home / Self Care | Payer: Managed Care, Other (non HMO) | Source: Ambulatory Visit | Attending: Radiation Oncology | Admitting: Radiation Oncology

## 2023-07-15 ENCOUNTER — Ambulatory Visit
Admission: RE | Admit: 2023-07-15 | Discharge: 2023-07-15 | Disposition: A | Discharge status: Home / Self Care | Payer: Managed Care, Other (non HMO) | Source: Ambulatory Visit | Attending: Radiation Oncology

## 2023-07-15 ENCOUNTER — Other Ambulatory Visit: Payer: Self-pay

## 2023-07-15 DIAGNOSIS — C50412 Malignant neoplasm of upper-outer quadrant of left female breast: Secondary | ICD-10-CM | POA: Diagnosis not present

## 2023-07-15 LAB — RAD ONC ARIA SESSION SUMMARY
Course Elapsed Days: 29
Plan Fractions Treated to Date: 20
Plan Fractions Treated to Date: 20
Plan Prescribed Dose Per Fraction: 1.8 Gy
Plan Prescribed Dose Per Fraction: 1.8 Gy
Plan Total Fractions Prescribed: 28
Plan Total Fractions Prescribed: 28
Plan Total Prescribed Dose: 50.4 Gy
Plan Total Prescribed Dose: 50.4 Gy
Reference Point Dosage Given to Date: 36 Gy
Reference Point Dosage Given to Date: 36 Gy
Reference Point Session Dosage Given: 1.8 Gy
Reference Point Session Dosage Given: 1.8 Gy
Session Number: 20

## 2023-07-18 ENCOUNTER — Ambulatory Visit
Admission: RE | Admit: 2023-07-18 | Discharge: 2023-07-18 | Disposition: A | Discharge status: Home / Self Care | Payer: Managed Care, Other (non HMO) | Source: Ambulatory Visit | Attending: Radiation Oncology | Admitting: Radiation Oncology

## 2023-07-18 ENCOUNTER — Other Ambulatory Visit: Payer: Self-pay

## 2023-07-18 DIAGNOSIS — Z1732 Human epidermal growth factor receptor 2 negative status: Secondary | ICD-10-CM | POA: Diagnosis not present

## 2023-07-18 DIAGNOSIS — Z1721 Progesterone receptor positive status: Secondary | ICD-10-CM | POA: Diagnosis not present

## 2023-07-18 DIAGNOSIS — Z510 Encounter for antineoplastic radiation therapy: Secondary | ICD-10-CM | POA: Insufficient documentation

## 2023-07-18 DIAGNOSIS — C50412 Malignant neoplasm of upper-outer quadrant of left female breast: Secondary | ICD-10-CM | POA: Insufficient documentation

## 2023-07-18 DIAGNOSIS — Z170 Estrogen receptor positive status [ER+]: Secondary | ICD-10-CM | POA: Insufficient documentation

## 2023-07-18 LAB — RAD ONC ARIA SESSION SUMMARY
Course Elapsed Days: 32
Plan Fractions Treated to Date: 21
Plan Fractions Treated to Date: 21
Plan Prescribed Dose Per Fraction: 1.8 Gy
Plan Prescribed Dose Per Fraction: 1.8 Gy
Plan Total Fractions Prescribed: 28
Plan Total Fractions Prescribed: 28
Plan Total Prescribed Dose: 50.4 Gy
Plan Total Prescribed Dose: 50.4 Gy
Reference Point Dosage Given to Date: 37.8 Gy
Reference Point Dosage Given to Date: 37.8 Gy
Reference Point Session Dosage Given: 1.8 Gy
Reference Point Session Dosage Given: 1.8 Gy
Session Number: 21

## 2023-07-19 ENCOUNTER — Other Ambulatory Visit: Payer: Self-pay

## 2023-07-19 ENCOUNTER — Ambulatory Visit
Admission: RE | Admit: 2023-07-19 | Discharge: 2023-07-19 | Disposition: A | Discharge status: Home / Self Care | Payer: Managed Care, Other (non HMO) | Source: Ambulatory Visit | Attending: Radiation Oncology

## 2023-07-19 DIAGNOSIS — Z510 Encounter for antineoplastic radiation therapy: Secondary | ICD-10-CM | POA: Diagnosis not present

## 2023-07-19 LAB — RAD ONC ARIA SESSION SUMMARY
Course Elapsed Days: 33
Plan Fractions Treated to Date: 22
Plan Fractions Treated to Date: 22
Plan Prescribed Dose Per Fraction: 1.8 Gy
Plan Prescribed Dose Per Fraction: 1.8 Gy
Plan Total Fractions Prescribed: 28
Plan Total Fractions Prescribed: 28
Plan Total Prescribed Dose: 50.4 Gy
Plan Total Prescribed Dose: 50.4 Gy
Reference Point Dosage Given to Date: 39.6 Gy
Reference Point Dosage Given to Date: 39.6 Gy
Reference Point Session Dosage Given: 1.8 Gy
Reference Point Session Dosage Given: 1.8 Gy
Session Number: 22

## 2023-07-20 ENCOUNTER — Ambulatory Visit
Admission: RE | Admit: 2023-07-20 | Discharge: 2023-07-20 | Disposition: A | Discharge status: Home / Self Care | Payer: Managed Care, Other (non HMO) | Source: Ambulatory Visit | Attending: Radiation Oncology | Admitting: Radiation Oncology

## 2023-07-20 ENCOUNTER — Other Ambulatory Visit: Payer: Self-pay

## 2023-07-20 DIAGNOSIS — Z510 Encounter for antineoplastic radiation therapy: Secondary | ICD-10-CM | POA: Diagnosis not present

## 2023-07-20 LAB — RAD ONC ARIA SESSION SUMMARY
Course Elapsed Days: 34
Plan Fractions Treated to Date: 23
Plan Fractions Treated to Date: 23
Plan Prescribed Dose Per Fraction: 1.8 Gy
Plan Prescribed Dose Per Fraction: 1.8 Gy
Plan Total Fractions Prescribed: 28
Plan Total Fractions Prescribed: 28
Plan Total Prescribed Dose: 50.4 Gy
Plan Total Prescribed Dose: 50.4 Gy
Reference Point Dosage Given to Date: 41.4 Gy
Reference Point Dosage Given to Date: 41.4 Gy
Reference Point Session Dosage Given: 1.8 Gy
Reference Point Session Dosage Given: 1.8 Gy
Session Number: 23

## 2023-07-21 ENCOUNTER — Other Ambulatory Visit: Payer: Self-pay

## 2023-07-21 ENCOUNTER — Ambulatory Visit
Admission: RE | Admit: 2023-07-21 | Discharge: 2023-07-21 | Disposition: A | Discharge status: Home / Self Care | Payer: Managed Care, Other (non HMO) | Source: Ambulatory Visit | Attending: Radiation Oncology | Admitting: Radiation Oncology

## 2023-07-21 DIAGNOSIS — Z510 Encounter for antineoplastic radiation therapy: Secondary | ICD-10-CM | POA: Diagnosis not present

## 2023-07-21 LAB — RAD ONC ARIA SESSION SUMMARY
Course Elapsed Days: 35
Plan Fractions Treated to Date: 24
Plan Fractions Treated to Date: 24
Plan Prescribed Dose Per Fraction: 1.8 Gy
Plan Prescribed Dose Per Fraction: 1.8 Gy
Plan Total Fractions Prescribed: 28
Plan Total Fractions Prescribed: 28
Plan Total Prescribed Dose: 50.4 Gy
Plan Total Prescribed Dose: 50.4 Gy
Reference Point Dosage Given to Date: 43.2 Gy
Reference Point Dosage Given to Date: 43.2 Gy
Reference Point Session Dosage Given: 1.8 Gy
Reference Point Session Dosage Given: 1.8 Gy
Session Number: 24

## 2023-07-22 ENCOUNTER — Other Ambulatory Visit: Payer: Self-pay

## 2023-07-22 ENCOUNTER — Ambulatory Visit
Admission: RE | Admit: 2023-07-22 | Discharge: 2023-07-22 | Disposition: A | Discharge status: Home / Self Care | Payer: Managed Care, Other (non HMO) | Source: Ambulatory Visit | Attending: Radiation Oncology | Admitting: Radiation Oncology

## 2023-07-22 ENCOUNTER — Ambulatory Visit: Payer: Managed Care, Other (non HMO) | Admitting: Radiation Oncology

## 2023-07-22 ENCOUNTER — Ambulatory Visit
Admission: RE | Admit: 2023-07-22 | Discharge: 2023-07-22 | Disposition: A | Discharge status: Home / Self Care | Payer: Managed Care, Other (non HMO) | Source: Ambulatory Visit | Attending: Radiation Oncology

## 2023-07-22 DIAGNOSIS — Z510 Encounter for antineoplastic radiation therapy: Secondary | ICD-10-CM | POA: Diagnosis not present

## 2023-07-22 LAB — RAD ONC ARIA SESSION SUMMARY
Course Elapsed Days: 36
Plan Fractions Treated to Date: 25
Plan Fractions Treated to Date: 25
Plan Prescribed Dose Per Fraction: 1.8 Gy
Plan Prescribed Dose Per Fraction: 1.8 Gy
Plan Total Fractions Prescribed: 28
Plan Total Fractions Prescribed: 28
Plan Total Prescribed Dose: 50.4 Gy
Plan Total Prescribed Dose: 50.4 Gy
Reference Point Dosage Given to Date: 45 Gy
Reference Point Dosage Given to Date: 45 Gy
Reference Point Session Dosage Given: 1.8 Gy
Reference Point Session Dosage Given: 1.8 Gy
Session Number: 25

## 2023-07-25 ENCOUNTER — Ambulatory Visit
Admission: RE | Admit: 2023-07-25 | Discharge: 2023-07-25 | Disposition: A | Discharge status: Home / Self Care | Payer: Managed Care, Other (non HMO) | Source: Ambulatory Visit | Attending: Radiation Oncology

## 2023-07-25 ENCOUNTER — Other Ambulatory Visit: Payer: Self-pay

## 2023-07-25 DIAGNOSIS — Z510 Encounter for antineoplastic radiation therapy: Secondary | ICD-10-CM | POA: Diagnosis not present

## 2023-07-25 LAB — RAD ONC ARIA SESSION SUMMARY
Course Elapsed Days: 39
Plan Fractions Treated to Date: 26
Plan Fractions Treated to Date: 26
Plan Prescribed Dose Per Fraction: 1.8 Gy
Plan Prescribed Dose Per Fraction: 1.8 Gy
Plan Total Fractions Prescribed: 28
Plan Total Fractions Prescribed: 28
Plan Total Prescribed Dose: 50.4 Gy
Plan Total Prescribed Dose: 50.4 Gy
Reference Point Dosage Given to Date: 46.8 Gy
Reference Point Dosage Given to Date: 46.8 Gy
Reference Point Session Dosage Given: 1.8 Gy
Reference Point Session Dosage Given: 1.8 Gy
Session Number: 26

## 2023-07-26 ENCOUNTER — Other Ambulatory Visit: Payer: Self-pay

## 2023-07-26 ENCOUNTER — Ambulatory Visit: Payer: Managed Care, Other (non HMO)

## 2023-07-26 ENCOUNTER — Ambulatory Visit
Admission: RE | Admit: 2023-07-26 | Discharge: 2023-07-26 | Disposition: A | Discharge status: Home / Self Care | Payer: Managed Care, Other (non HMO) | Source: Ambulatory Visit | Attending: Radiation Oncology | Admitting: Radiation Oncology

## 2023-07-26 DIAGNOSIS — Z510 Encounter for antineoplastic radiation therapy: Secondary | ICD-10-CM | POA: Diagnosis not present

## 2023-07-26 LAB — RAD ONC ARIA SESSION SUMMARY
Course Elapsed Days: 40
Plan Fractions Treated to Date: 27
Plan Fractions Treated to Date: 27
Plan Prescribed Dose Per Fraction: 1.8 Gy
Plan Prescribed Dose Per Fraction: 1.8 Gy
Plan Total Fractions Prescribed: 28
Plan Total Fractions Prescribed: 28
Plan Total Prescribed Dose: 50.4 Gy
Plan Total Prescribed Dose: 50.4 Gy
Reference Point Dosage Given to Date: 48.6 Gy
Reference Point Dosage Given to Date: 48.6 Gy
Reference Point Session Dosage Given: 1.8 Gy
Reference Point Session Dosage Given: 1.8 Gy
Session Number: 27

## 2023-07-27 ENCOUNTER — Ambulatory Visit
Admission: RE | Admit: 2023-07-27 | Discharge: 2023-07-27 | Disposition: A | Discharge status: Home / Self Care | Payer: Managed Care, Other (non HMO) | Source: Ambulatory Visit | Attending: Radiation Oncology | Admitting: Radiation Oncology

## 2023-07-27 ENCOUNTER — Other Ambulatory Visit: Payer: Self-pay

## 2023-07-27 ENCOUNTER — Ambulatory Visit: Payer: Managed Care, Other (non HMO)

## 2023-07-27 DIAGNOSIS — Z510 Encounter for antineoplastic radiation therapy: Secondary | ICD-10-CM | POA: Diagnosis not present

## 2023-07-27 LAB — RAD ONC ARIA SESSION SUMMARY
Course Elapsed Days: 41
Plan Fractions Treated to Date: 28
Plan Fractions Treated to Date: 28
Plan Prescribed Dose Per Fraction: 1.8 Gy
Plan Prescribed Dose Per Fraction: 1.8 Gy
Plan Total Fractions Prescribed: 28
Plan Total Fractions Prescribed: 28
Plan Total Prescribed Dose: 50.4 Gy
Plan Total Prescribed Dose: 50.4 Gy
Reference Point Dosage Given to Date: 50.4 Gy
Reference Point Dosage Given to Date: 50.4 Gy
Reference Point Session Dosage Given: 1.8 Gy
Reference Point Session Dosage Given: 1.8 Gy
Session Number: 28

## 2023-07-28 ENCOUNTER — Ambulatory Visit
Admission: RE | Admit: 2023-07-28 | Discharge: 2023-07-28 | Disposition: A | Discharge status: Home / Self Care | Payer: Managed Care, Other (non HMO) | Source: Ambulatory Visit | Attending: Radiation Oncology | Admitting: Radiation Oncology

## 2023-07-28 ENCOUNTER — Other Ambulatory Visit: Payer: Self-pay

## 2023-07-28 ENCOUNTER — Ambulatory Visit: Payer: Managed Care, Other (non HMO)

## 2023-07-28 DIAGNOSIS — Z510 Encounter for antineoplastic radiation therapy: Secondary | ICD-10-CM | POA: Diagnosis not present

## 2023-07-28 LAB — RAD ONC ARIA SESSION SUMMARY
Course Elapsed Days: 42
Plan Fractions Treated to Date: 1
Plan Prescribed Dose Per Fraction: 2 Gy
Plan Total Fractions Prescribed: 5
Plan Total Prescribed Dose: 10 Gy
Reference Point Dosage Given to Date: 2 Gy
Reference Point Session Dosage Given: 2 Gy
Session Number: 29

## 2023-07-29 ENCOUNTER — Ambulatory Visit
Admission: RE | Admit: 2023-07-29 | Discharge: 2023-07-29 | Disposition: A | Discharge status: Home / Self Care | Payer: Managed Care, Other (non HMO) | Source: Ambulatory Visit | Attending: Radiation Oncology | Admitting: Radiation Oncology

## 2023-07-29 ENCOUNTER — Other Ambulatory Visit: Payer: Self-pay

## 2023-07-29 ENCOUNTER — Ambulatory Visit: Payer: Managed Care, Other (non HMO)

## 2023-07-29 DIAGNOSIS — Z510 Encounter for antineoplastic radiation therapy: Secondary | ICD-10-CM | POA: Diagnosis not present

## 2023-07-29 LAB — RAD ONC ARIA SESSION SUMMARY
Course Elapsed Days: 43
Plan Fractions Treated to Date: 2
Plan Prescribed Dose Per Fraction: 2 Gy
Plan Total Fractions Prescribed: 5
Plan Total Prescribed Dose: 10 Gy
Reference Point Dosage Given to Date: 4 Gy
Reference Point Session Dosage Given: 2 Gy
Session Number: 30

## 2023-08-01 ENCOUNTER — Ambulatory Visit
Admission: RE | Admit: 2023-08-01 | Discharge: 2023-08-01 | Disposition: A | Discharge status: Home / Self Care | Payer: Managed Care, Other (non HMO) | Source: Ambulatory Visit | Attending: Radiation Oncology

## 2023-08-01 ENCOUNTER — Other Ambulatory Visit: Payer: Self-pay

## 2023-08-01 ENCOUNTER — Ambulatory Visit: Payer: Managed Care, Other (non HMO)

## 2023-08-01 DIAGNOSIS — Z510 Encounter for antineoplastic radiation therapy: Secondary | ICD-10-CM | POA: Diagnosis not present

## 2023-08-01 LAB — RAD ONC ARIA SESSION SUMMARY
Course Elapsed Days: 46
Plan Fractions Treated to Date: 3
Plan Prescribed Dose Per Fraction: 2 Gy
Plan Total Fractions Prescribed: 5
Plan Total Prescribed Dose: 10 Gy
Reference Point Dosage Given to Date: 6 Gy
Reference Point Session Dosage Given: 2 Gy
Session Number: 31

## 2023-08-02 ENCOUNTER — Ambulatory Visit: Payer: Managed Care, Other (non HMO)

## 2023-08-02 ENCOUNTER — Ambulatory Visit
Admission: RE | Admit: 2023-08-02 | Discharge: 2023-08-02 | Disposition: A | Discharge status: Home / Self Care | Payer: Managed Care, Other (non HMO) | Source: Ambulatory Visit | Attending: Radiation Oncology | Admitting: Radiation Oncology

## 2023-08-02 ENCOUNTER — Other Ambulatory Visit: Payer: Self-pay

## 2023-08-02 DIAGNOSIS — Z510 Encounter for antineoplastic radiation therapy: Secondary | ICD-10-CM | POA: Diagnosis not present

## 2023-08-02 LAB — RAD ONC ARIA SESSION SUMMARY
Course Elapsed Days: 47
Plan Fractions Treated to Date: 4
Plan Prescribed Dose Per Fraction: 2 Gy
Plan Total Fractions Prescribed: 5
Plan Total Prescribed Dose: 10 Gy
Reference Point Dosage Given to Date: 8 Gy
Reference Point Session Dosage Given: 2 Gy
Session Number: 32

## 2023-08-03 ENCOUNTER — Inpatient Hospital Stay (HOSPITAL_BASED_OUTPATIENT_CLINIC_OR_DEPARTMENT_OTHER): Payer: Managed Care, Other (non HMO) | Admitting: Hematology and Oncology

## 2023-08-03 ENCOUNTER — Other Ambulatory Visit: Payer: Self-pay

## 2023-08-03 ENCOUNTER — Ambulatory Visit
Admission: RE | Admit: 2023-08-03 | Discharge: 2023-08-03 | Disposition: A | Discharge status: Home / Self Care | Payer: Managed Care, Other (non HMO) | Source: Ambulatory Visit | Attending: Radiation Oncology

## 2023-08-03 ENCOUNTER — Ambulatory Visit
Admission: RE | Admit: 2023-08-03 | Discharge: 2023-08-03 | Disposition: A | Discharge status: Home / Self Care | Payer: Managed Care, Other (non HMO) | Source: Ambulatory Visit | Attending: Radiation Oncology | Admitting: Radiation Oncology

## 2023-08-03 VITALS — BP 118/75 | HR 72 | Temp 97.9°F | Resp 18 | Ht 65.0 in | Wt 148.3 lb

## 2023-08-03 DIAGNOSIS — Z1721 Progesterone receptor positive status: Secondary | ICD-10-CM | POA: Insufficient documentation

## 2023-08-03 DIAGNOSIS — Z170 Estrogen receptor positive status [ER+]: Secondary | ICD-10-CM | POA: Insufficient documentation

## 2023-08-03 DIAGNOSIS — C50412 Malignant neoplasm of upper-outer quadrant of left female breast: Secondary | ICD-10-CM | POA: Insufficient documentation

## 2023-08-03 DIAGNOSIS — Z79899 Other long term (current) drug therapy: Secondary | ICD-10-CM | POA: Insufficient documentation

## 2023-08-03 DIAGNOSIS — Z923 Personal history of irradiation: Secondary | ICD-10-CM | POA: Insufficient documentation

## 2023-08-03 DIAGNOSIS — Z1732 Human epidermal growth factor receptor 2 negative status: Secondary | ICD-10-CM | POA: Insufficient documentation

## 2023-08-03 DIAGNOSIS — Z510 Encounter for antineoplastic radiation therapy: Secondary | ICD-10-CM | POA: Diagnosis not present

## 2023-08-03 LAB — RAD ONC ARIA SESSION SUMMARY
Course Elapsed Days: 48
Plan Fractions Treated to Date: 5
Plan Prescribed Dose Per Fraction: 2 Gy
Plan Total Fractions Prescribed: 5
Plan Total Prescribed Dose: 10 Gy
Reference Point Dosage Given to Date: 10 Gy
Reference Point Session Dosage Given: 2 Gy
Session Number: 33

## 2023-08-03 MED ORDER — TAMOXIFEN CITRATE 20 MG PO TABS: 20.0000 mg | ORAL_TABLET | Freq: Every day | ORAL | 3 refills | Status: DC

## 2023-08-03 NOTE — Assessment & Plan Note (Addendum)
04/08/2023:Screening mammogram detected left breast 2 masses total measuring 2.6 cm (1.5 cm and 1.1 cm) biopsy revealed grade 1 IDC ER 100% PR 100% HER2 negative, Ki-67 5%  05/04/2023: Left lumpectomy: Grade 1 IDC 2.1 cm with DCIS, margins negative, LVI not identified, 1/2 lymph nodes positive with extracapsular extension, ER 60%, PR 70%, HER2 negative, Ki-67 5% Oncotype DX recurrence score: 20    Treatment plan: Oncotype DX: 20 (no role of chemotherapy) Adjuvant radiation therapy 06/17/23-08/03/23 Adjuvant antiestrogen therapy --------------------------------------------------------------------------------------------------------------------  Tamoxifen counseling: We discussed the risks and benefits of tamoxifen. These include but not limited to insomnia, hot flashes, mood changes, vaginal dryness, and weight gain. Although rare, serious side effects including endometrial cancer, risk of blood clots were also discussed. We strongly believe that the benefits far outweigh the risks. Patient understands these risks and consented to starting treatment. Planned treatment duration is 10 years.  RTC in 3 months for SCP visit

## 2023-08-03 NOTE — Progress Notes (Signed)
Patient Care Team: Soundra Pilon, FNP as PCP - General (Family Medicine) Donnelly Angelica, RN as Oncology Nurse Navigator Pershing Proud, RN as Oncology Nurse Navigator Emelia Loron, MD as Consulting Physician (General Surgery) Serena Croissant, MD as Consulting Physician (Hematology and Oncology) Dorothy Puffer, MD as Consulting Physician (Radiation Oncology)  DIAGNOSIS:  Encounter Diagnosis  Name Primary?   Malignant neoplasm of upper-outer quadrant of left breast in female, estrogen receptor positive (HCC) Yes    SUMMARY OF ONCOLOGIC HISTORY: Oncology History  Malignant neoplasm of upper-outer quadrant of left breast in female, estrogen receptor positive (HCC)  04/08/2023 Initial Diagnosis   Screening mammogram detected left breast 2 masses total measuring 2.6 cm (1.5 cm and 1.1 cm) biopsy revealed grade 1 IDC ER 100% PR 100% HER2 negative, Ki-67 5%   04/20/2023 Cancer Staging   Staging form: Breast, AJCC 8th Edition - Clinical stage from 04/20/2023: Stage IA (cT1c, cN0, cM0, G1, ER+, PR+, HER2-) - Signed by Ronny Bacon, PA-C on 04/20/2023 Stage prefix: Initial diagnosis Histologic grading system: 3 grade system   05/02/2023 Genetic Testing   Negative [facility] CancerNext+RNAinsight Panel.  Report date is 05/02/2023.   The [employer] CancerNext+RNAinsight Panel includes sequencing, rearrangement analysis, and RNA analysis for the following 34 genes: APC, ATM, BARD1, BMPR1A, BRCA1, BRCA2, BRIP1, CDH1, CDK4, CDKN2A, CHEK2, DICER1, MLH1, MSH2, MSH6, MUTYH, NF1, NTHL1, PALB2, PMS2, PTEN, RAD51C, RAD51D, SMAD4, SMARCA4, STK11 and TP53 (sequencing and deletion/duplication); AXIN2, HOXB13, MSH3, POLD1 and POLE (sequencing only); EPCAM and GREM1 (deletion/duplication only).    05/31/2023 Cancer Staging   Staging form: Breast, AJCC 8th Edition - Pathologic: Stage IA (pT2, pN1a, cM0, G1, ER+, PR+, HER2-, Oncotype DX score: 20) - Signed by Serena Croissant, MD on 05/31/2023 Multigene  prognostic tests performed: Oncotype DX Recurrence score range: Greater than or equal to 11 Histologic grading system: 3 grade system     CHIEF COMPLIANT: Follow-up after radiation is complete  HISTORY OF PRESENT ILLNESS:  History of Present Illness   Heidi Nolan is a 53 year old female who presents for follow-up after completing radiation therapy.  She has completed her radiation therapy today and tolerated it well without significant fatigue. She has been able to maintain her work schedule throughout the treatment.  Her last menstrual cycle was in September of the previous year, which is relevant to her treatment plan involving tamoxifen.  No significant symptoms were reported during the review of systems, aside from a minor acknowledgment of being 'a tiny bit' tired, which she attributes to her busy schedule rather than the radiation therapy.         ALLERGIES:  has no known allergies.  MEDICATIONS:  Current Outpatient Medications  Medication Sig Dispense Refill   amphetamine-dextroamphetamine (ADDERALL) 20 MG tablet Take 20 mg by mouth in the morning and at bedtime.     betamethasone dipropionate 0.05 % cream Apply 1 Application topically daily as needed (skin irritation/psoriasis).     Cholecalciferol (VITAMIN D3) 50 MCG (2000 UT) TABS Take 2,000 Units by mouth in the morning.     famotidine (PEPCID) 20 MG tablet Take 20 mg by mouth in the morning.     loratadine (CLARITIN) 10 MG tablet Take 10 mg by mouth in the morning.     metoprolol tartrate (LOPRESSOR) 25 MG tablet Take 1 tablet (25 mg total) by mouth once as needed for up to 1 dose. Take this tab as needed if you feel palpitations and your heart rate is elevated  15 tablet 0   montelukast (SINGULAIR) 10 MG tablet Take 10 mg by mouth in the morning.  3   Probiotic Product (PROBIOTIC PO) Take 1 capsule by mouth in the morning.     [START ON 08/17/2023] tamoxifen (NOLVADEX) 20 MG tablet Take 1 tablet (20 mg total) by  mouth daily. 90 tablet 3   Tapinarof (VTAMA EX) Apply 1 Application topically daily.     VENTOLIN HFA 108 (90 BASE) MCG/ACT inhaler Inhale 2 puffs into the lungs every 4 (four) hours as needed for wheezing.     No current facility-administered medications for this visit.    PHYSICAL EXAMINATION: ECOG PERFORMANCE STATUS: 1 - Symptomatic but completely ambulatory  Vitals:   08/03/23 0809  BP: 118/75  Pulse: 72  Resp: 18  Temp: 97.9 F (36.6 C)  SpO2: (!) 10%   Filed Weights   08/03/23 0809  Weight: 148 lb 4.8 oz (67.3 kg)     LABORATORY DATA:  I have reviewed the data as listed    Latest Ref Rng & Units 04/20/2023   12:05 PM 01/27/2023    1:18 PM 10/01/2021    7:33 AM  CMP  Glucose 70 - 99 mg/dL 95  91  010   BUN 6 - 20 mg/dL 21  14  12    Creatinine 0.44 - 1.00 mg/dL 2.72  5.36  6.44   Sodium 135 - 145 mmol/L 137  136  139   Potassium 3.5 - 5.1 mmol/L 4.0  3.6  4.2   Chloride 98 - 111 mmol/L 102  104  102   CO2 22 - 32 mmol/L 31  21  24    Calcium 8.9 - 10.3 mg/dL 9.4  8.8  03.4   Total Protein 6.5 - 8.1 g/dL 7.0     Total Bilirubin <1.2 mg/dL 0.5     Alkaline Phos 38 - 126 U/L 51     AST 15 - 41 U/L 17     ALT 0 - 44 U/L 19       Lab Results  Component Value Date   WBC 7.0 04/20/2023   HGB 13.7 04/20/2023   HCT 39.5 04/20/2023   MCV 91.6 04/20/2023   PLT 377 04/20/2023   NEUTROABS 3.6 04/20/2023    ASSESSMENT & PLAN:  Malignant neoplasm of upper-outer quadrant of left breast in female, estrogen receptor positive (HCC) 04/08/2023:Screening mammogram detected left breast 2 masses total measuring 2.6 cm (1.5 cm and 1.1 cm) biopsy revealed grade 1 IDC ER 100% PR 100% HER2 negative, Ki-67 5%  05/04/2023: Left lumpectomy: Grade 1 IDC 2.1 cm with DCIS, margins negative, LVI not identified, 1/2 lymph nodes positive with extracapsular extension, ER 60%, PR 70%, HER2 negative, Ki-67 5% Oncotype DX recurrence score: 20    Treatment plan: Oncotype DX: 20 (no role of  chemotherapy) Adjuvant radiation therapy 06/17/23-08/03/23 Adjuvant antiestrogen therapy --------------------------------------------------------------------------------------------------------------------  Tamoxifen counseling: We discussed the risks and benefits of tamoxifen. These include but not limited to insomnia, hot flashes, mood changes, vaginal dryness, and weight gain. Although rare, serious side effects including endometrial cancer, risk of blood clots were also discussed. We strongly believe that the benefits far outweigh the risks. Patient understands these risks and consented to starting treatment.   Once she is in menopause (last menstrual cycle was September 2024), we will consider switching her to anastrozole therapy. RTC in 3 months for SCP visit   No orders of the defined types were placed in this encounter.  The patient has  a good understanding of the overall plan. she agrees with it. she will call with any problems that may develop before the next visit here. Total time spent: 30 mins including face to face time and time spent for planning, charting and co-ordination of care   Tamsen Meek, MD 08/03/23

## 2023-08-04 NOTE — Radiation Completion Notes (Addendum)
  Radiation Oncology         [phone] ________________________________  Name: Heidi Nolan MRN: [id number]  Date of Service: 08/03/2023  DOB: 1971-02-11  End of Treatment Note     Diagnosis: Stage IA, pT2N1M0, grade 1, ER/PR positive invasive ductal carcinoma of the left breast   Intent: Curative     ==========DELIVERED PLANS==========  First Treatment Date: 2023-06-16 Last Treatment Date: 2023-08-03   Plan Name: Breast_L_BH Site: Breast, Left Technique: 3D Mode: Photon Dose Per Fraction: 1.8 Gy Prescribed Dose (Delivered / Prescribed): 50.4 Gy / 50.4 Gy Prescribed Fxs (Delivered / Prescribed): 28 / 28   Plan Name: SCV_L_Brst_BH Site: Sclav-LT Technique: 3D Mode: Photon Dose Per Fraction: 1.8 Gy Prescribed Dose (Delivered / Prescribed): 50.4 Gy / 50.4 Gy Prescribed Fxs (Delivered / Prescribed): 28 / 28   Plan Name: Brst_L_Bst_BH Site: Breast, Left Technique: 3D Mode: Photon Dose Per Fraction: 2 Gy Prescribed Dose (Delivered / Prescribed): 10 Gy / 10 Gy Prescribed Fxs (Delivered / Prescribed): 5 / 5     ==========ON TREATMENT VISIT DATES========== 2023-06-21, 2023-06-24, 2023-07-01, 2023-07-15, 2023-07-22, 2023-07-28, 2023-08-03    See weekly On Treatment Notes in Epic for details in the Media tab (listed as Progress notes on the On Treatment Visit Dates listed above).The patient tolerated radiation. She developed fatigue and anticipated skin changes in the treatment field.   The patient will receive a call in about one month from the radiation oncology department. She will continue follow up with Dr. Pamelia Hoit as well.      Osker Mason, PAC

## 2023-08-04 NOTE — Therapy (Signed)
  OUTPATIENT PHYSICAL THERAPY SOZO SCREENING NOTE   Patient Name: Heidi Nolan MRN: [id number] DOB:February 10, 1971, 53 y.o., female Today's Date: 08/08/2023  PCP: Soundra Pilon, FNP REFERRING PROVIDER: Emelia Loron, MD   PT End of Session - 08/08/23 1051     Visit Number 2   unchanged due to screen only   PT Start Time 1051    PT Stop Time 1055    PT Time Calculation (min) 4 min             Past Medical History:  Diagnosis Date   Abnormal Pap smear of cervix    ADHD (attention deficit hyperactivity disorder)    Arthritis    hips and back   Cancer of left breast (HCC)    Cervical disc disease    SVT (supraventricular tachycardia) (HCC)    Past Surgical History:  Procedure Laterality Date   AXILLARY LYMPH NODE BIOPSY Left 05/04/2023   Procedure: LEFT AXILLARY SENTINEL LYMPH NODE BIOPSY;  Surgeon: Emelia Loron, MD;  Location: [facility] OR;  Service: General;  Laterality: Left;   BREAST BIOPSY Left 04/08/2023   [place] LT BREAST BX W LOC DEV 1ST LESION IMG BX SPEC US GUIDE 04/08/2023 [facility]   BREAST BIOPSY  05/03/2023   MM LT RADIOACTIVE SEED LOC MAMMO GUIDE 05/03/2023 [facility]   BREAST LUMPECTOMY WITH RADIOACTIVE SEED LOCALIZATION Left 05/04/2023   Procedure: LEFT BREAST SEED BRACKETED LUMPECTOMY;  Surgeon: Emelia Loron, MD;  Location: [facility] OR;  Service: General;  Laterality: Left;   CESAREAN SECTION  1991   NECK SURGERY  9/14   [facility]   Patient Active Problem List   Diagnosis Date Noted   Genetic testing 05/02/2023   Malignant neoplasm of upper-outer quadrant of left breast in female, estrogen receptor positive (HCC) 04/18/2023   HSV (herpes simplex virus) anogenital infection 09/01/2014   Rectal fissure 09/01/2014   Cervical spinal stenosis 03/01/2013    REFERRING DIAG: left breast cancer at risk for lymphedema  THERAPY DIAG:  Malignant neoplasm of upper-outer quadrant of left breast in female, estrogen receptor positive  (HCC)  PERTINENT HISTORY: Patient was diagnosed on 03/09/2023 with 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 She underwent a left lumpectomy and sentinel node biopsy (1 of 2 nodes positive for carcinoma) on 05/04/2023. It is ER/PR positive and HER2 negative with a Ki67 of 5%. Completed radiation    PRECAUTIONS: left UE Lymphedema risk,   SUBJECTIVE: Here for 3 month SOZO screen  PAIN:  Are you having pain? Yes: NPRS scale: 8/10 Pain location: L chest Pain description: shooting Aggravating factors: nothing Relieving factors: nothing  SOZO SCREENING: Patient was assessed today using the SOZO machine to determine the lymphedema index score. This was compared to her baseline score. It was determined that she is within the recommended range when compared to her baseline and no further action is needed at this time. She will continue SOZO screenings. These are done every 3 months for 2 years post operatively followed by every 6 months for 2 years, and then annually.  Pt reports shooting pain in L chest and reports she just completed radiation and her doctor states it is normal. Educated pt if it continues for another 2 weeks to let us or her doctor know so she can be referred back to PT.     [facility] [place], PT 08/08/2023, 10:57 AM

## 2023-08-08 ENCOUNTER — Ambulatory Visit: Payer: Managed Care, Other (non HMO) | Attending: General Surgery | Admitting: Physical Therapy

## 2023-08-08 DIAGNOSIS — Z170 Estrogen receptor positive status [ER+]: Secondary | ICD-10-CM | POA: Insufficient documentation

## 2023-08-08 DIAGNOSIS — C50412 Malignant neoplasm of upper-outer quadrant of left female breast: Secondary | ICD-10-CM | POA: Insufficient documentation

## 2023-09-05 ENCOUNTER — Ambulatory Visit
Admission: RE | Admit: 2023-09-05 | Discharge: 2023-09-05 | Disposition: A | Discharge status: Home / Self Care | Payer: Managed Care, Other (non HMO) | Source: Ambulatory Visit | Attending: Radiation Oncology | Admitting: Radiation Oncology

## 2023-09-05 NOTE — Progress Notes (Signed)
  Radiation Oncology         [id number]) [phone] ________________________________  Name: Heidi Nolan MRN: [id number]  Date of Service: 09/05/2023  DOB: 18-Jun-1970  Post Treatment Telephone Note  Diagnosis:  Stage IA, pT2N1M0, grade 1, ER/PR positive invasive ductal carcinoma of the left breast (as documented in provider EOT note)  The patient was not available for call today. Voicemail left.  The patient was encouraged to avoid sun exposure in the area of prior treatment for up to one year following radiation with either sunscreen or by the style of clothing worn in the sun.  The patient has scheduled follow up with her medical oncologist Dr. Pamelia Hoit for ongoing surveillance, and was encouraged to call if she develops concerns or questions regarding radiation.    Ruel Favors, LPN

## 2023-10-12 ENCOUNTER — Ambulatory Visit: Attending: Physician Assistant

## 2023-10-12 DIAGNOSIS — M2569 Stiffness of other specified joint, not elsewhere classified: Secondary | ICD-10-CM | POA: Insufficient documentation

## 2023-10-12 DIAGNOSIS — M545 Low back pain, unspecified: Secondary | ICD-10-CM | POA: Insufficient documentation

## 2023-10-12 DIAGNOSIS — M6281 Muscle weakness (generalized): Secondary | ICD-10-CM | POA: Diagnosis present

## 2023-10-12 NOTE — Addendum Note (Signed)
 Addended by: Lane Pinon on: 10/12/2023 04:31 PM   Modules accepted: Orders

## 2023-10-12 NOTE — Therapy (Addendum)
 OUTPATIENT PHYSICAL THERAPY THORACOLUMBAR EVALUATION   Patient Name: Heidi Nolan MRN: [id number] DOB:1970/08/07, 53 y.o., female Today's Date: 10/12/2023  END OF SESSION:  PT End of Session - 10/12/23 0929     Visit Number 1    Number of Visits 8    Date for PT Re-Evaluation 11/11/23    PT Start Time 0855    PT Stop Time 0927    PT Time Calculation (min) 32 min    Behavior During Therapy [facility] for tasks assessed/performed             Past Medical History:  Diagnosis Date   Abnormal Pap smear of cervix    ADHD (attention deficit hyperactivity disorder)    Arthritis    hips and back   Cancer of left breast (HCC)    Cervical disc disease    SVT (supraventricular tachycardia) (HCC)    Past Surgical History:  Procedure Laterality Date   AXILLARY LYMPH NODE BIOPSY Left 05/04/2023   Procedure: LEFT AXILLARY SENTINEL LYMPH NODE BIOPSY;  Surgeon: Enid Harry, MD;  Location: [facility] OR;  Service: General;  Laterality: Left;   BREAST BIOPSY Left 04/08/2023   US  LT BREAST BX W LOC DEV 1ST LESION IMG BX SPEC US  GUIDE 04/08/2023 [facility]   BREAST BIOPSY  05/03/2023   MM LT RADIOACTIVE SEED LOC MAMMO GUIDE 05/03/2023 [facility]   BREAST LUMPECTOMY WITH RADIOACTIVE SEED LOCALIZATION Left 05/04/2023   Procedure: LEFT BREAST SEED BRACKETED LUMPECTOMY;  Surgeon: Enid Harry, MD;  Location: [facility];  Service: General;  Laterality: Left;   CESAREAN SECTION  1991   NECK SURGERY  9/14   [facility]   Patient Active Problem List   Diagnosis Date Noted   Genetic testing 05/02/2023   Malignant neoplasm of upper-outer quadrant of left breast in female, estrogen receptor positive (HCC) 04/18/2023   HSV (herpes simplex virus) anogenital infection 09/01/2014   Rectal fissure 09/01/2014   Cervical spinal stenosis 03/01/2013    PCP: Alejandro Hurt, FNP  REFERRING PROVIDER: Lucie Ruts, PA-C  REFERRING DIAG: LUMBAGO  Rationale for Evaluation and Treatment:  Rehabilitation  THERAPY DIAG:  Acute bilateral low back pain without sciatica  Muscle weakness (generalized)  ONSET DATE: 10/01/23  SUBJECTIVE:                                                                                                                                                                                           SUBJECTIVE STATEMENT: Patient reported low back pain that began on 10/01/23 while performing yard work with no identifiable cause. She stated that although the pain has improved since initial onset,  it continues to impact her daily function. She notices her pain most in the morning and at night after getting back from work. As a Firefighter, she experiences difficulty lifting and carrying items, and reports significant pain with sitting after prolonged workdays. Additionally, her pain impacts her ability to adjust her positioning while sleeping and she frequently has to push on her thighs when returning from bent over positioning. She reported that there are times when she bends over and the pain radiates out laterally on the left side of her back. At time of evaluation, she reported 5-6/10 pain and 7-8/10 pain at its worst. Pain limits her ability to lift and carry objects and to sit or stand for long periods of time. She stated aggravating factors of her pain include bending over, lifting objects, sit-to-stand or stand-to-sit transfers. Relieving factors include wearing a back brace, muscle relaxer's and Advil. Her primary goals are to increase her back strength and perform her work duties with less pain.    PERTINENT HISTORY:  History of malignant neoplasm of upper-outer quadrant of left breast in female and currently in remission, cervical spinal stenosis, ADHD, arthritis of hip and back, rectal fissure, SVT, cervical disc disease, smoker  PAIN:  Are you having pain? Yes: NPRS scale: 5-6/10 at time of evaluation, 7-8/10 at its worst. Pain location: Low back Pain  description: Pressure, ache Aggravating factors: bending over, lifting objects, sit-to-stand or stand-to-stand transfers Relieving factors: wearing a back brace, muscle relaxer's and Advil.  PRECAUTIONS: None  RED FLAGS: None   WEIGHT BEARING RESTRICTIONS: No  FALLS:  Has patient fallen in last 6 months? No  LIVING ENVIRONMENT: Lives with: lives with their spouse Lives in: House/apartment Stairs: Yes: External: 2 front porch steps; can reach both Has following equipment at home: None  OCCUPATION: Risk manager  PLOF: Independent  PATIENT GOALS: Perform work duties, improve strength, decrease pain, sleep longer, improve movement, return to recreational activities/sports, stand longer, sit longer, less difficulty with home activities  NEXT MD VISIT: MRI on May 2nd.  OBJECTIVE:  Note: Objective measures were completed at Evaluation unless otherwise noted.  PATIENT SURVEYS:  Modified Oswestry 20/50   COGNITION: Overall cognitive status:  WFL      SENSATION: WFL  PALPATION: TTP over L4-L5 SP's, L3-L4 and L4-L5 facet joints, QL's, erector spinae's in lumbar region  PASSIVE ACCESSORY MOTION:   L4-L5, L3-L4 left facet joints: hypomobile and pain L4 and L5 Spinous processes: hypomobile and pain   LUMBAR ROM:   AROM eval  Flexion 65 degrees, reproduced her pain  Extension 7, reported stiffness  Right lateral flexion 20  Lumbar Quadrant: WFL  Left lateral flexion 15, reproduced her pain  Lumbar Quadrant: pain  Right rotation WFL  Left rotation WFL   (Blank rows = not tested)   LUMBAR SPECIAL TESTS:  Straight leg raise test: Negative  FUNCTIONAL TESTS:  Glute bridge: 9 seconds and LBP Side bridge: Right: 11 sec Left: 7 sec, felt her pain symptoms in low back on both sides Double-leg lowering: 65 degrees, LBP   GAIT: Distance walked: 82 Assistive device utilized: None Level of assistance: Complete Independence Comments: WFL   PATIENT EDUCATION:   Education details: Patient educated on objective findings, POC, prognosis and goals for therapy.  Person educated: Patient Education method: Explanation Education comprehension: verbalized understanding  HOME EXERCISE PROGRAM:   ASSESSMENT:  CLINICAL IMPRESSION:  Patient is a 53 y.o. female who was seen today for physical therapy evaluation and treatment for low back  pain. Objective findings of LBP include weakness and pain with functional tests, painful lumbar flexion AROM, limited and painful lateral flexion AROM, and limited lumbar extension AROM. Additionally the patient had pain with left lumbar quadrant motion, hypomobile and painful passive accessory motion of L4-5 spinous processes's and L3-L4 and L4-L5 left facet joints, TTP over L4-L5 spinous processes, L3-L4 and L4-L5 facet joints, QL's, erector spinae's in lumbar region  Patient would benefit from skilled physical therapy to address impairments in order to maximize her functional mobility.   OBJECTIVE IMPAIRMENTS: decreased activity tolerance, decreased endurance, decreased mobility, decreased ROM, decreased strength, hypomobility, impaired flexibility, and pain.   ACTIVITY LIMITATIONS: carrying, lifting, bending, sitting, standing, squatting, sleeping, bed mobility, and dressing  PARTICIPATION LIMITATIONS: meal prep, cleaning, laundry, driving, occupation, and yard work  PERSONAL FACTORS: 3+ comorbidities: Malignant neoplasm of upper-outer quadrant of left breast in female, cervical spinal stenosis, ADHD, arthritis of hip and back, rectal fissure, SVT, cervical disc disease, smoker  are also affecting patient's functional outcome.   REHAB POTENTIAL: Good  CLINICAL DECISION MAKING: Stable/uncomplicated  EVALUATION COMPLEXITY: Low   GOALS: Goals reviewed with patient? No  LONG TERM GOALS: Target date: 11/11/23  Patient will be independent will her HEP.   Baseline:  Goal status: INITIAL  2.  Patient will increase  right and left side bridge hold times to at least 30 seconds bilaterally to improve core endurance needed for prolonged standing and lifting tasks at work.  Baseline:  Goal status: INITIAL  3.  Patient will improve trunk flexor control by maintaining posterior pelvic tilt during double-leg lowering to at least 40 degrees of hip flexion without lumbar spine lift-off in order to enhance core stability during lifting and and transitional movements.  Baseline:  Goal status: INITIAL  4.  Patients lateral flexion will increase to at least 25 degrees bilaterally in order to more easily perform activities such as reaching down to the side to load or unload items.  Baseline:  Goal status: INITIAL  5.  Patient's pain at its worst will decrease from 7-8 to 5-6 which will enhance her ability to stand and sit for longer periods of time.  Baseline:  Goal status: INITIAL  6.  Patient's Modified Oswestry Disability Index score will decrease from 20/50 to 15/50 which will support greater confidence in performing ADL's.  Baseline:  Goal status: INITIAL  7. Patient's extension ROM will increase to at lest 10 degrees which will enhance her ability to perform sit-to-stand transfers.  Baseline:  Goal status: INITIAL  PLAN:  PT FREQUENCY: 1-2x/week  PT DURATION: 4 weeks  PLANNED INTERVENTIONS: 97164- PT Re-evaluation, 97110-Therapeutic exercises, 97530- Therapeutic activity, 97112- Neuromuscular re-education, 97535- Self Care, 16109- Manual therapy, G0283- Electrical stimulation (unattended), Patient/Family education, Dry Needling, Joint mobilization, Spinal mobilization, Cryotherapy, and Moist heat.  PLAN FOR NEXT SESSION: Arm bike, low back iso's, core strengthening, modalities as needed    Julliana Whitmyer, Student-PT 10/12/2023, 2:44 PM

## 2023-10-19 ENCOUNTER — Encounter

## 2023-10-20 ENCOUNTER — Encounter (HOSPITAL_COMMUNITY): Payer: Self-pay | Admitting: Obstetrics and Gynecology

## 2023-10-20 NOTE — Progress Notes (Signed)
 Spoke w/ via phone for pre-op interview--- Heidi Nolan Lab needs dos----  CBC and UPT per surgeon.        Lab results------Current EKG dated 04/13/23 in Epic. Current Echo- EF 66% dated 01/12/21. COVID test -----patient states asymptomatic no test needed Arrive at -------0700 NPO after MN NO Solid Food.   Pre-Surgery Ensure or G2:  Med rec completed Medications to take morning of surgery ----- Pepcid and Albuterol PRN Diabetic medication -----  GLP1 agonist last dose: GLP1 instructions:  Patient instructed no nail polish to be worn day of surgery Patient instructed to bring photo id and insurance card day of surgery Patient aware to have Driver (ride ) / caregiver    for 24 hours after surgery -  Russella Courts Patient Special Instructions ----- Shower with antibacterial soap. Pre-Op special Instructions -----  Patient verbalized understanding of instructions that were given at this phone interview. Patient denies chest pain, sob, fever, cough at the interview.

## 2023-10-26 ENCOUNTER — Telehealth: Payer: Self-pay | Admitting: Adult Health

## 2023-10-26 NOTE — H&P (Signed)
 Heidi Nolan is an 53 y.o. female. At annual exam last October, having regular menses monthly but heavy, uterus ULNS on exam. Pelvic u/s in October with possible adenomyosis, one small subserosal myoma, possible submucosal myoma vs polyp. 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    Past Medical History:  Diagnosis Date   Abnormal Pap smear of cervix    ADHD (attention deficit hyperactivity disorder)    Arthritis    hips and back   Cancer of left breast (HCC)    Cervical disc disease    SVT (supraventricular tachycardia) (HCC)     Past Surgical History:  Procedure Laterality Date   AXILLARY LYMPH NODE BIOPSY Left 05/04/2023   Procedure: LEFT AXILLARY SENTINEL LYMPH NODE BIOPSY;  Surgeon: Enid Harry, MD;  Location: [facility] OR;  Service: General;  Laterality: Left;   BREAST BIOPSY Left 04/08/2023   US  LT BREAST BX W LOC DEV 1ST LESION IMG BX SPEC US  GUIDE 04/08/2023 [facility]   BREAST BIOPSY  05/03/2023   MM LT RADIOACTIVE SEED LOC MAMMO GUIDE 05/03/2023 [facility]   BREAST LUMPECTOMY WITH RADIOACTIVE SEED LOCALIZATION Left 05/04/2023   Procedure: LEFT BREAST SEED BRACKETED LUMPECTOMY;  Surgeon: Enid Harry, MD;  Location: [facility] OR;  Service: General;  Laterality: Left;   CESAREAN SECTION  1991   NECK SURGERY  9/14   [facility]    Family History  Problem Relation Age of Onset   Osteoarthritis Mother    Heart attack Father 34       Died of MI   Colon cancer Maternal Aunt        dx >50   Colon cancer Maternal Aunt        dx <50   Throat cancer Paternal Uncle        dx >50; or lung?   Breast cancer Maternal Grandmother        dx 6s   Lung cancer Paternal Grandfather        dx >50    Social History:  reports  that she has been smoking cigarettes. She has a 15 pack-year smoking history. She has never used smokeless tobacco. She reports current alcohol use of about 5.0 - 7.0 standard drinks of alcohol per week. She reports that she does not use drugs.  Allergies: No Known Allergies  No medications prior to admission.    Review of Systems  Respiratory: Negative.    Cardiovascular: Negative.     Height 5\' 5"  (1.651 m), weight 65.8 kg, last menstrual period 02/13/2023. Physical Exam Constitutional:      Appearance: Normal appearance.  Cardiovascular:     Rate and Rhythm: Normal rate and regular rhythm.     Heart sounds: Normal heart sounds. No murmur heard. Pulmonary:     Effort: Pulmonary effort is normal. No respiratory distress.     Breath sounds: Normal breath sounds. No wheezing.  Abdominal:     General: There is no distension.     Palpations: Abdomen is soft. There is no mass.     Tenderness: There is no abdominal tenderness.  Genitourinary:    General: Normal vulva.     Comments: Uterus ULNS No adnexal mass Musculoskeletal:     Cervical back: Normal range of motion and neck supple.  Neurological:  Mental Status: She is alert.     No results found for this or any previous visit (from the past 24 hours).  No results found.  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 All medical and surgical options have been discussed. Surgical procedure, risks, alternatives, chances of relieving her HMB have all been discussed, questions answered. Will admit for hysteroscopy, D&C, Myosure resection of endometrial mass  Heidi Nolan 10/26/2023, 7:32 PM

## 2023-10-27 ENCOUNTER — Encounter (HOSPITAL_COMMUNITY)
Admission: RE | Disposition: A | Discharge status: Home / Self Care | Payer: Self-pay | Source: Home / Self Care | Attending: Obstetrics and Gynecology

## 2023-10-27 ENCOUNTER — Ambulatory Visit (HOSPITAL_COMMUNITY): Payer: Self-pay

## 2023-10-27 ENCOUNTER — Ambulatory Visit (HOSPITAL_BASED_OUTPATIENT_CLINIC_OR_DEPARTMENT_OTHER): Payer: Self-pay

## 2023-10-27 ENCOUNTER — Encounter (HOSPITAL_COMMUNITY): Payer: Self-pay | Admitting: Obstetrics and Gynecology

## 2023-10-27 ENCOUNTER — Other Ambulatory Visit: Payer: Self-pay

## 2023-10-27 ENCOUNTER — Ambulatory Visit (HOSPITAL_COMMUNITY)
Admission: RE | Admit: 2023-10-27 | Discharge: 2023-10-27 | Disposition: A | Discharge status: Home / Self Care | Attending: Obstetrics and Gynecology | Admitting: Obstetrics and Gynecology

## 2023-10-27 DIAGNOSIS — N920 Excessive and frequent menstruation with regular cycle: Secondary | ICD-10-CM | POA: Diagnosis not present

## 2023-10-27 DIAGNOSIS — N950 Postmenopausal bleeding: Secondary | ICD-10-CM | POA: Diagnosis not present

## 2023-10-27 DIAGNOSIS — I10 Essential (primary) hypertension: Secondary | ICD-10-CM | POA: Diagnosis not present

## 2023-10-27 DIAGNOSIS — M1990 Unspecified osteoarthritis, unspecified site: Secondary | ICD-10-CM | POA: Insufficient documentation

## 2023-10-27 DIAGNOSIS — F909 Attention-deficit hyperactivity disorder, unspecified type: Secondary | ICD-10-CM | POA: Insufficient documentation

## 2023-10-27 DIAGNOSIS — K219 Gastro-esophageal reflux disease without esophagitis: Secondary | ICD-10-CM | POA: Diagnosis not present

## 2023-10-27 DIAGNOSIS — Z803 Family history of malignant neoplasm of breast: Secondary | ICD-10-CM | POA: Insufficient documentation

## 2023-10-27 DIAGNOSIS — I472 Ventricular tachycardia, unspecified: Secondary | ICD-10-CM | POA: Diagnosis not present

## 2023-10-27 DIAGNOSIS — Z853 Personal history of malignant neoplasm of breast: Secondary | ICD-10-CM | POA: Insufficient documentation

## 2023-10-27 DIAGNOSIS — D250 Submucous leiomyoma of uterus: Secondary | ICD-10-CM | POA: Diagnosis present

## 2023-10-27 DIAGNOSIS — F17210 Nicotine dependence, cigarettes, uncomplicated: Secondary | ICD-10-CM | POA: Diagnosis not present

## 2023-10-27 HISTORY — PX: DILATATION & CURRETTAGE/HYSTEROSCOPY WITH RESECTOCOPE: SHX5572

## 2023-10-27 HISTORY — PX: MYOSURE RESECTION: SHX7611

## 2023-10-27 LAB — CBC
HCT: 37.1 % (ref 36.0–46.0)
Hemoglobin: 12.5 g/dL (ref 12.0–15.0)
MCH: 32.3 pg (ref 26.0–34.0)
MCHC: 33.7 g/dL (ref 30.0–36.0)
MCV: 95.9 fL (ref 80.0–100.0)
Platelets: 300 K/uL (ref 150–400)
RBC: 3.87 MIL/uL (ref 3.87–5.11)
RDW: 14.3 % (ref 11.5–15.5)
WBC: 6 K/uL (ref 4.0–10.5)
nRBC: 0 % (ref 0.0–0.2)

## 2023-10-27 LAB — POCT PREGNANCY, URINE: Preg Test, Ur: NEGATIVE

## 2023-10-27 SURGERY — DILATATION & CURETTAGE/HYSTEROSCOPY WITH RESECTOCOPE
Anesthesia: General | Site: Vagina

## 2023-10-27 MED ORDER — IBUPROFEN 600 MG PO TABS: 600.0000 mg | ORAL_TABLET | Freq: Four times a day (QID) | ORAL | 0 refills | Status: DC | PRN

## 2023-10-27 MED ORDER — ONDANSETRON HCL 4 MG/2ML IJ SOLN
INTRAMUSCULAR | Status: AC
  Filled 2023-10-27: qty 2

## 2023-10-27 MED ORDER — CHLORHEXIDINE GLUCONATE 0.12 % MT SOLN
OROMUCOSAL | Status: AC
  Filled 2023-10-27: qty 15

## 2023-10-27 MED ORDER — ACETAMINOPHEN 500 MG PO TABS
1000.0000 mg | ORAL_TABLET | Freq: Once | ORAL | Status: AC
Start: 2023-10-27 — End: 2023-10-27
  Administered 2023-10-27: 1000 mg via ORAL

## 2023-10-27 MED ORDER — DEXMEDETOMIDINE HCL IN NACL 80 MCG/20ML IV SOLN
INTRAVENOUS | Status: DC | PRN
  Administered 2023-10-27: 8 ug via INTRAVENOUS

## 2023-10-27 MED ORDER — DEXAMETHASONE SODIUM PHOSPHATE 10 MG/ML IJ SOLN
INTRAMUSCULAR | Status: DC | PRN
  Administered 2023-10-27: 10 mg via INTRAVENOUS

## 2023-10-27 MED ORDER — KETOROLAC TROMETHAMINE 30 MG/ML IJ SOLN
INTRAMUSCULAR | Status: AC
  Filled 2023-10-27: qty 1

## 2023-10-27 MED ORDER — LIDOCAINE HCL 2 % IJ SOLN
INTRAMUSCULAR | Status: DC | PRN
  Administered 2023-10-27: 16 mL

## 2023-10-27 MED ORDER — SODIUM CHLORIDE 0.9 % IR SOLN
Status: DC | PRN
  Administered 2023-10-27: 3000 mL

## 2023-10-27 MED ORDER — FENTANYL CITRATE (PF) 100 MCG/2ML IJ SOLN
INTRAMUSCULAR | Status: DC | PRN
  Administered 2023-10-27: 50 ug via INTRAVENOUS

## 2023-10-27 MED ORDER — LACTATED RINGERS IV SOLN: INTRAVENOUS | Status: DC

## 2023-10-27 MED ORDER — ONDANSETRON HCL 4 MG/2ML IJ SOLN
INTRAMUSCULAR | Status: DC | PRN
  Administered 2023-10-27: 4 mg via INTRAVENOUS

## 2023-10-27 MED ORDER — LIDOCAINE 2% (20 MG/ML) 5 ML SYRINGE
INTRAMUSCULAR | Status: AC
  Filled 2023-10-27: qty 5

## 2023-10-27 MED ORDER — PROPOFOL 10 MG/ML IV BOLUS
INTRAVENOUS | Status: AC
  Filled 2023-10-27: qty 20

## 2023-10-27 MED ORDER — FENTANYL CITRATE (PF) 100 MCG/2ML IJ SOLN: 25.0000 ug | INTRAMUSCULAR | Status: DC | PRN

## 2023-10-27 MED ORDER — MIDAZOLAM HCL 5 MG/5ML IJ SOLN
INTRAMUSCULAR | Status: DC | PRN
  Administered 2023-10-27: 2 mg via INTRAVENOUS

## 2023-10-27 MED ORDER — LIDOCAINE HCL 2 % IJ SOLN
INTRAMUSCULAR | Status: AC
  Filled 2023-10-27: qty 20

## 2023-10-27 MED ORDER — FENTANYL CITRATE (PF) 250 MCG/5ML IJ SOLN
INTRAMUSCULAR | Status: AC
  Filled 2023-10-27: qty 5

## 2023-10-27 MED ORDER — MIDAZOLAM HCL 2 MG/2ML IJ SOLN
INTRAMUSCULAR | Status: AC
  Filled 2023-10-27: qty 2

## 2023-10-27 MED ORDER — ONDANSETRON HCL 4 MG/2ML IJ SOLN: 4.0000 mg | Freq: Once | INTRAMUSCULAR | Status: DC | PRN

## 2023-10-27 MED ORDER — DEXAMETHASONE SODIUM PHOSPHATE 10 MG/ML IJ SOLN
INTRAMUSCULAR | Status: AC
  Filled 2023-10-27: qty 1

## 2023-10-27 MED ORDER — KETOROLAC TROMETHAMINE 30 MG/ML IJ SOLN
INTRAMUSCULAR | Status: DC | PRN
  Administered 2023-10-27: 30 mg via INTRAVENOUS

## 2023-10-27 MED ORDER — ACETAMINOPHEN 500 MG PO TABS
ORAL_TABLET | ORAL | Status: AC
  Filled 2023-10-27: qty 2

## 2023-10-27 MED ORDER — AMISULPRIDE (ANTIEMETIC) 5 MG/2ML IV SOLN: 10.0000 mg | Freq: Once | INTRAVENOUS | Status: DC | PRN

## 2023-10-27 MED ORDER — ORAL CARE MOUTH RINSE: 15.0000 mL | Freq: Once | OROMUCOSAL | Status: AC

## 2023-10-27 MED ORDER — LIDOCAINE 2% (20 MG/ML) 5 ML SYRINGE
INTRAMUSCULAR | Status: DC | PRN
Start: 2023-10-27 — End: 2023-10-27
  Administered 2023-10-27: 60 mg via INTRAVENOUS

## 2023-10-27 MED ORDER — CHLORHEXIDINE GLUCONATE 0.12 % MT SOLN
15.0000 mL | Freq: Once | OROMUCOSAL | Status: AC
  Administered 2023-10-27: 15 mL via OROMUCOSAL

## 2023-10-27 MED ORDER — PROPOFOL 10 MG/ML IV BOLUS
INTRAVENOUS | Status: DC | PRN
  Administered 2023-10-27: 150 mg via INTRAVENOUS

## 2023-10-27 SURGICAL SUPPLY — 17 items
GLOVE BIOGEL PI IND STRL 7.0 (GLOVE) IMPLANT
GLOVE ORTHO TXT STRL SZ7.5 (GLOVE) ×2 IMPLANT
GLOVE SURG SYN 6.5 ES PF (GLOVE) ×4 IMPLANT
GLOVE SURG SYN 6.5 PF PI (GLOVE) IMPLANT
GLOVE SURG UNDER POLY LF SZ7 (GLOVE) ×2 IMPLANT
GOWN STRL REUS W/ TWL LRG LVL3 (GOWN DISPOSABLE) IMPLANT
GOWN STRL REUS W/ TWL XL LVL3 (GOWN DISPOSABLE) ×2 IMPLANT
KIT PROCEDURE FLUENT (KITS) ×2 IMPLANT
KIT TURNOVER KIT B (KITS) ×2 IMPLANT
PACK VAGINAL MINOR WOMEN LF (CUSTOM PROCEDURE TRAY) ×2 IMPLANT
PAD OB MATERNITY 11 LF (PERSONAL CARE ITEMS) ×2 IMPLANT
SEAL ROD LENS SCOPE MYOSURE (ABLATOR) ×2 IMPLANT
SOL .9 NS 3000ML IRR UROMATIC (IV SOLUTION) IMPLANT
SOL PREP POV-IOD 4OZ 10% (MISCELLANEOUS) IMPLANT
SYSTEM TISS REMOVAL MYOSURE XL (MISCELLANEOUS) IMPLANT
TOWEL GREEN STERILE FF (TOWEL DISPOSABLE) ×2 IMPLANT
UNDERPAD 30X36 HEAVY ABSORB (UNDERPADS AND DIAPERS) ×2 IMPLANT

## 2023-10-27 NOTE — Anesthesia Procedure Notes (Addendum)
 Procedure Name: LMA Insertion Date/Time: 10/27/2023 9:09 AM  Performed by: Ezzie Holstein, CRNAPre-anesthesia Checklist: Patient identified, Emergency Drugs available, Suction available and Patient being monitored Patient Re-evaluated:Patient Re-evaluated prior to induction Oxygen Delivery Method: Circle System Utilized Preoxygenation: Pre-oxygenation with 100% oxygen Induction Type: IV induction Ventilation: Mask ventilation without difficulty LMA: LMA inserted LMA Size: 4.0 Number of attempts: 1 Placement Confirmation: positive ETCO2 Tube secured with: Tape Dental Injury: Teeth and Oropharynx as per pre-operative assessment  Comments: LMA by Darlina Einstein, SRNA

## 2023-10-27 NOTE — Anesthesia Preprocedure Evaluation (Addendum)
 Anesthesia Evaluation  Patient identified by MRN, date of birth, ID band Patient awake    Reviewed: Allergy & Precautions, NPO status , Patient's Chart, lab work & pertinent test results, reviewed documented beta blocker date and time   Airway Mallampati: II  TM Distance: >3 FB Neck ROM: Full    Dental  (+) Teeth Intact, Dental Advisory Given   Pulmonary Current Smoker and Patient abstained from smoking.   Pulmonary exam normal breath sounds clear to auscultation       Cardiovascular hypertension, Pt. on home beta blockers Normal cardiovascular exam+ dysrhythmias Supra Ventricular Tachycardia  Rhythm:Regular Rate:Normal     Neuro/Psych negative neurological ROS     GI/Hepatic Neg liver ROS,GERD  Medicated,,  Endo/Other  negative endocrine ROS    Renal/GU negative Renal ROS     Musculoskeletal  (+) Arthritis ,    Abdominal   Peds  (+) ADHD Hematology negative hematology ROS (+)   Anesthesia Other Findings Day of surgery medications reviewed with the patient.  Reproductive/Obstetrics Submucous leiomyoma of uterus                             Anesthesia Physical Anesthesia Plan  ASA: 2  Anesthesia Plan: General   Post-op Pain Management: Tylenol  PO (pre-op)* and Toradol  IV (intra-op)*   Induction: Intravenous  PONV Risk Score and Plan: 3 and Scopolamine patch - Pre-op, Midazolam , Dexamethasone  and Ondansetron   Airway Management Planned: LMA  Additional Equipment:   Intra-op Plan:   Post-operative Plan: Extubation in OR  Informed Consent: I have reviewed the patients History and Physical, chart, labs and discussed the procedure including the risks, benefits and alternatives for the proposed anesthesia with the patient or authorized representative who has indicated his/her understanding and acceptance.     Dental advisory given  Plan Discussed with: CRNA  Anesthesia Plan  Comments:        Anesthesia Quick Evaluation

## 2023-10-27 NOTE — Op Note (Signed)
 Preoperative diagnosis: Postmenopausal bleeding, submucosal myoma Postoperative diagnosis: Same Procedure: Hysteroscopy with Myosure resection of myoma Surgeon: Cyd Dowse M.D. Anesthesia: Gen. With an LMA, paracervical block Findings: She had a normal endometrial cavity with a large myoma filling the endometrial cavity bisecting it, minimal endometrial tissue Estimated blood loss: Minimal Fluid deficit: Through the hysteroscope fluid deficit was about 180 cc Specimens: Endometrial resection sent for routine pathology Complications: None  Procedure in detail: The patient was taken to the operating room and placed in the dorsosupine position. General anesthesia was induced. She was placed in mobile stirrups and legs were elevated. Perineum and vagina were prepped and draped in the usual sterile fashion and bladder drained with a red Robinson catheter. A Graves speculum was inserted in the vagina and the anterior lip of the cervix was grasped with a single-tooth tenaculum. Paracervical block was performed with a total of 16 cc of 2% plain lidocaine . The uterus then sounded to 8 cm. The cervix was gradually dilated to a size 19 dilator without difficulty. The Myosure hysteroscope was inserted and good visualization was achieved.  The submucossal myoma was easily identified, bisecting the endometrial cavity into left and right halves.  The Myosure XL was inserted and the myoma was completely resected, as well as some small pieces of endometrial tissue.  The endometrial cavity was now normal, no other visible lesions, normal tubal ostia.  The hysteroscope was removed. The single-tooth tenaculum was removed from the cervix and bleeding was controlled with pressure. All instruments were then removed from the vagina. The patient tolerated the procedure well and was taken to the recovery in stable condition. Counts were correct and she had PAS hose on throughout the procedure.

## 2023-10-27 NOTE — Interval H&P Note (Signed)
 History and Physical Interval Note:  10/27/2023 8:27 AM  Heidi Nolan  has presented today for surgery, with the diagnosis of submucosal myoma.  The various methods of treatment have been discussed with the patient and family. After consideration of risks, benefits and other options for treatment, the patient has consented to  Procedure(s) with comments: DILATATION & CURETTAGE/HYSTEROSCOPY WITH RESECTOCOPE (N/A) - Myosure MYOSURE RESECTION OF MYOMA (N/A) as a surgical intervention.  The patient's history has been reviewed, patient examined, no change in status, stable for surgery.  I have reviewed the patient's chart and labs.  Questions were answered to the patient's satisfaction.     Demetra Filter Iasia Forcier

## 2023-10-27 NOTE — Transfer of Care (Signed)
 Immediate Anesthesia Transfer of Care Note  Patient: Heidi Nolan  Procedure(s) Performed: DILATATION & CURETTAGE/HYSTEROSCOPY (Vagina ) MYOSURE RESECTION OF MYOMA (Uterus)  Patient Location: PACU  Anesthesia Type:General  Level of Consciousness: awake, alert , oriented, and patient cooperative  Airway & Oxygen Therapy: Patient Spontanous Breathing  Post-op Assessment: Report given to RN and Post -op Vital signs reviewed and stable  Post vital signs: Reviewed and stable  Last Vitals:  Vitals Value Taken Time  BP 146/85 10/27/23 0947  Temp    Pulse 61 10/27/23 0950  Resp 13 10/27/23 0950  SpO2 98 % 10/27/23 0950  Vitals shown include unfiled device data.  Last Pain:  Vitals:   10/27/23 0730  TempSrc:   PainSc: 0-No pain      Patients Stated Pain Goal: 3 (10/27/23 0725)  Complications: No notable events documented.

## 2023-10-27 NOTE — Discharge Instructions (Addendum)
-  No acetaminophen /Tylenol  until after 1:30 pm today if needed.  -No ibuprofen, Advil, Aleve, Motrin, ketorolac , meloxicam, naproxen, or other NSAIDS until after 3:33 pm today if needed.   D & C Home care Instructions:   Personal hygiene:  Used sanitary napkins for vaginal drainage not tampons. Shower or tub bathe the day after your procedure. No douching until bleeding stops. Always wipe from front to back after  Elimination.  Activity: Do not drive or operate any equipment today. The effects of the anesthesia are still present and drowsiness may result. Rest today, not necessarily flat bed rest, just take it easy. You may resume your normal activity in one to 2 days.  Sexual activity: No intercourse for one week or as indicated by your physician  Diet: Eat a light diet as desired this evening. You may resume a regular diet tomorrow.  Return to work: One to 2 days.  General Expectations of your surgery: Vaginal bleeding should be no heavier than a normal period. Spotting may continue up to 10 days. Mild cramps may continue for a couple of days. You may have a regular period in 2-6 weeks.  Unexpected observations call your doctor if these occur: persistent or heavy bleeding. Severe abdominal cramping or pain. Elevation of temperature greater than 100F.      Post Anesthesia Home Care Instructions  Activity: Get plenty of rest for the remainder of the day. A responsible individual must stay with you for 24 hours following the procedure.  For the next 24 hours, DO NOT: -Drive a car -Advertising copywriter -Drink alcoholic beverages -Take any medication unless instructed by your physician -Make any legal decisions or sign important papers.  Meals: Start with liquid foods such as gelatin or soup. Progress to regular foods as tolerated. Avoid greasy, spicy, heavy foods. If nausea and/or vomiting occur, drink only clear liquids until the nausea and/or vomiting subsides. Call your physician  if vomiting continues.  Special Instructions/Symptoms: Your throat may feel dry or sore from the anesthesia or the breathing tube placed in your throat during surgery. If this causes discomfort, gargle with warm salt water. The discomfort should disappear within 24 hours.

## 2023-10-28 ENCOUNTER — Encounter (HOSPITAL_COMMUNITY): Payer: Self-pay | Admitting: Obstetrics and Gynecology

## 2023-10-28 LAB — SURGICAL PATHOLOGY

## 2023-10-28 NOTE — Anesthesia Postprocedure Evaluation (Signed)
 Anesthesia Post Note  Patient: Heidi Nolan  Procedure(s) Performed: HYSTEROSCOPY (Vagina ) MYOSURE RESECTION OF MYOMA (Uterus)     Patient location during evaluation: PACU Anesthesia Type: General Level of consciousness: awake and alert Pain management: pain level controlled Vital Signs Assessment: post-procedure vital signs reviewed and stable Respiratory status: spontaneous breathing, nonlabored ventilation and respiratory function stable Cardiovascular status: blood pressure returned to baseline and stable Postop Assessment: no apparent nausea or vomiting Anesthetic complications: no   No notable events documented.  Last Vitals:  Vitals:   10/27/23 1000 10/27/23 1015  BP: (!) 151/94 (!) 152/95  Pulse: 62 (!) 59  Resp: 14 12  Temp:  36.4 C  SpO2: 100% 99%    Last Pain:  Vitals:   10/27/23 1015  TempSrc:   PainSc: 0-No pain                 Erin Havers

## 2023-10-31 ENCOUNTER — Encounter: Payer: Managed Care, Other (non HMO) | Admitting: Adult Health

## 2023-11-08 ENCOUNTER — Ambulatory Visit: Payer: Managed Care, Other (non HMO) | Attending: General Surgery

## 2023-11-08 VITALS — Wt 148.1 lb

## 2023-11-08 DIAGNOSIS — Z483 Aftercare following surgery for neoplasm: Secondary | ICD-10-CM | POA: Insufficient documentation

## 2023-11-08 NOTE — Therapy (Signed)
 OUTPATIENT PHYSICAL THERAPY SOZO SCREENING NOTE   Patient Name: [name] MRN: [id number] DOB:[birth year], 53 y.o., female Today's Date: 11/08/2023  PCP: Alejandro Hurt, FNP REFERRING PROVIDER: Enid Harry, MD   PT End of Session - 11/08/23 1043     Visit Number 1   # unchanged due to screen only   PT Start Time 1042    PT Stop Time 1046    PT Time Calculation (min) 4 min    Activity Tolerance Patient tolerated treatment well    Behavior During Therapy WFL for tasks assessed/performed             Past Medical History:  Diagnosis Date   Abnormal Pap smear of cervix    ADHD (attention deficit hyperactivity disorder)    Arthritis    hips and back   Cancer of left breast (HCC)    Cervical disc disease    SVT (supraventricular tachycardia) (HCC)    Past Surgical History:  Procedure Laterality Date   AXILLARY LYMPH NODE BIOPSY Left 05/04/2023   Procedure: LEFT AXILLARY SENTINEL LYMPH NODE BIOPSY;  Surgeon: Enid Harry, MD;  Location: [facility] OR;  Service: General;  Laterality: Left;   BREAST BIOPSY Left 04/08/2023   US  LT BREAST BX W LOC DEV 1ST LESION IMG BX SPEC US  GUIDE 04/08/2023 [facility]   BREAST BIOPSY  05/03/2023   MM LT RADIOACTIVE SEED LOC MAMMO GUIDE 05/03/2023 [facility]   BREAST LUMPECTOMY WITH RADIOACTIVE SEED LOCALIZATION Left 05/04/2023   Procedure: LEFT BREAST SEED BRACKETED LUMPECTOMY;  Surgeon: Enid Harry, MD;  Location: [facility] OR;  Service: General;  Laterality: Left;   CESAREAN SECTION  1991   DILATATION & CURRETTAGE/HYSTEROSCOPY WITH RESECTOCOPE N/A 10/27/2023   Procedure: HYSTEROSCOPY;  Surgeon: Cyd Dowse, MD;  Location: [facility] OR;  Service: Gynecology;  Laterality: N/A;  Myosure   MYOSURE RESECTION N/A 10/27/2023   Procedure: Almeda Jacobs RESECTION OF MYOMA;  Surgeon: Cyd Dowse, MD;  Location: [facility] OR;  Service: Gynecology;  Laterality: N/A;   NECK SURGERY  9/14   [facility]   Patient Active Problem List   Diagnosis  Date Noted   Genetic testing 05/02/2023   Malignant neoplasm of upper-outer quadrant of left breast in female, estrogen receptor positive (HCC) 04/18/2023   HSV (herpes simplex virus) anogenital infection 09/01/2014   Rectal fissure 09/01/2014   Cervical spinal stenosis 03/01/2013    REFERRING DIAG: left breast cancer at risk for lymphedema  THERAPY DIAG:  Aftercare following surgery for neoplasm  PERTINENT HISTORY: Patient was diagnosed on 03/09/2023 with 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 She underwent a left lumpectomy and sentinel node biopsy (1 of 2 nodes positive for carcinoma) on 05/04/2023. It is ER/PR positive and HER2 negative with a Ki67 of 5%. Completed radiation    PRECAUTIONS: left UE Lymphedema risk,   SUBJECTIVE: Here for 3 month SOZO screen  PAIN:  Are you having pain? No  SOZO SCREENING: Patient was assessed today using the SOZO machine to determine the lymphedema index score. This was compared to her baseline score. It was determined that she is within the recommended range when compared to her baseline and no further action is needed at this time. She will continue SOZO screenings. These are done every 3 months for 2 years post operatively followed by every 6 months for 2 years, and then annually.   L-DEX FLOWSHEETS - 11/08/23 1000       L-DEX LYMPHEDEMA SCREENING   Measurement Type Unilateral  L-DEX MEASUREMENT EXTREMITY Upper Extremity    POSITION  Standing    DOMINANT SIDE Right    At Risk Side Left    BASELINE SCORE (UNILATERAL) 0.4    L-DEX SCORE (UNILATERAL) -3.2    VALUE CHANGE (UNILAT) -3.6               Denyce Flank, PTA 11/08/2023, 10:44 AM

## 2023-11-17 ENCOUNTER — Encounter: Payer: Self-pay | Admitting: Adult Health

## 2023-11-17 ENCOUNTER — Inpatient Hospital Stay: Attending: Adult Health | Admitting: Adult Health

## 2023-11-17 VITALS — BP 135/81 | HR 77 | Temp 98.1°F | Resp 18 | Ht 65.0 in | Wt 147.0 lb

## 2023-11-17 DIAGNOSIS — N951 Menopausal and female climacteric states: Secondary | ICD-10-CM | POA: Diagnosis not present

## 2023-11-17 DIAGNOSIS — Z923 Personal history of irradiation: Secondary | ICD-10-CM | POA: Insufficient documentation

## 2023-11-17 DIAGNOSIS — Z170 Estrogen receptor positive status [ER+]: Secondary | ICD-10-CM | POA: Diagnosis not present

## 2023-11-17 DIAGNOSIS — Z79810 Long term (current) use of selective estrogen receptor modulators (SERMs): Secondary | ICD-10-CM | POA: Diagnosis not present

## 2023-11-17 DIAGNOSIS — C50412 Malignant neoplasm of upper-outer quadrant of left female breast: Secondary | ICD-10-CM

## 2023-11-17 DIAGNOSIS — F17210 Nicotine dependence, cigarettes, uncomplicated: Secondary | ICD-10-CM | POA: Diagnosis not present

## 2023-11-17 DIAGNOSIS — Z122 Encounter for screening for malignant neoplasm of respiratory organs: Secondary | ICD-10-CM | POA: Diagnosis not present

## 2023-11-17 NOTE — Patient Instructions (Signed)
 Venlafaxine Extended-Release Capsules What is this medication? VENLAFAXINE (VEN la fax een) treats depression and anxiety. It increases the amount of serotonin and norepinephrine in the brain, hormones that help regulate mood. It belongs to a group of medications called SNRIs. This medicine may be used for other purposes; ask your health care provider or pharmacist if you have questions. COMMON BRAND NAME(S): Effexor XR What should I tell my care team before I take this medication? They need to know if you have any of these conditions: Bleeding disorders Glaucoma Heart disease High blood pressure High cholesterol Kidney disease Liver disease Low levels of sodium in the blood Mania or bipolar disorder Seizures Suicidal thoughts, plans, or attempt by you or a family member Take medications that treat or prevent blood clots Thyroid disease An unusual or allergic reaction to venlafaxine, other medications, foods, dyes, or preservatives Pregnant or trying to get pregnant Breastfeeding How should I use this medication? Take this medication by mouth with a full glass of water. Take it as directed on the prescription label. Do not cut, crush, or chew this medication. Take it with food. You may open the capsule and put the contents in 1 teaspoon of applesauce. Swallow the medication and applesauce right away. Do not chew the medication or applesauce. Follow with a glass of water to ensure complete swallowing of the pellets. Try to take your medication at about the same time each day. Do not take your medication more often than directed. Keep taking this medication unless your care team tells you to stop. Stopping it too quickly can cause serious side effects. It can also make your condition worse. A special MedGuide will be given to you by the pharmacist with each prescription and refill. Be sure to read this information carefully each time. Talk to your care team about the use of this medication in  children. Special care may be needed. Overdosage: If you think you have taken too much of this medicine contact a poison control center or emergency room at once. NOTE: This medicine is only for you. Do not share this medicine with others. What if I miss a dose? If you miss a dose, take it as soon as you can. If it is almost time for your next dose, take only that dose. Do not take double or extra doses. What may interact with this medication? Do not take this medication with any of the following: Alcohol Certain medications for fungal infections, such as fluconazole, itraconazole, ketoconazole, posaconazole, voriconazole Cisapride Desvenlafaxine Dronedarone Duloxetine Levomilnacipran Linezolid MAOIs, such as Carbex, Eldepryl, Marplan, Nardil, and Parnate Methylene blue (injected into a vein) Milnacipran Pimozide Thioridazine This medication may also interact with the following: Amphetamines Aspirin and aspirin-like medications Certain medications for mental health conditions Certain medications for migraine headaches, such as almotriptan, eletriptan, frovatriptan, naratriptan, rizatriptan, sumatriptan, zolmitriptan Certain medications for sleep Certain medications that treat or prevent blood clots, such as dalteparin, enoxaparin, warfarin Cimetidine Clozapine Diuretics Fentanyl  Furazolidone Indinavir Isoniazid Lithium Metoprolol  NSAIDS, medications for pain and inflammation, such as ibuprofen  or naproxen Other medications that cause heart rhythm changes Procarbazine Rasagiline Supplements, such as St. John's wort, kava kava, valerian Tramadol Tryptophan This list may not describe all possible interactions. Give your health care provider a list of all the medicines, herbs, non-prescription drugs, or dietary supplements you use. Also tell them if you smoke, drink alcohol, or use illegal drugs. Some items may interact with your medicine. What should I watch for while using  this medication? Tell  your care team if your symptoms do not get better or if they get worse. Visit your care team for regular checks on your progress. Because it may take several weeks to see the full effects of this medication, it is important to continue your treatment as prescribed by your care team. Watch for new or worsening thoughts of suicide or depression. This includes sudden changes in mood, behaviors, or thoughts. These changes can happen at any time but are more common in the beginning of treatment or after a change in dose. Call your care team right away if you experience these thoughts or worsening depression. This medication may cause mood and behavior changes, such as anxiety, nervousness, irritability, hostility, restlessness, excitability, hyperactivity, or trouble sleeping. These changes can happen at any time but are more common in the beginning of treatment or after a change in dose. Call your care team right away if you notice any of these symptoms. This medication can cause an increase in blood pressure. Check with your care team for instructions on monitoring your blood pressure while taking this medication. This medication may affect your coordination, reaction time, or judgment. Do not drive or operate machinery until you know how this medication affects you. Sit up or stand slowly to reduce the risk of dizzy or fainting spells. Drinking alcohol with this medication can increase the risk of these side effects. Your mouth may get dry. Chewing sugarless gum or sucking hard candy and drinking plenty of water may help. Contact your care team if the problem does not go away or is severe. What side effects may I notice from receiving this medication? Side effects that you should report to your care team as soon as possible: Allergic reactions--skin rash, itching, hives, swelling of the face, lips, tongue, or throat Bleeding--bloody or black, tar-like stools, red or dark brown urine,  vomiting blood or brown material that looks like coffee grounds, small, red or purple spots on skin, unusual bleeding or bruising Heart rhythm changes--fast or irregular heartbeat, dizziness, feeling faint or lightheaded, chest pain, trouble breathing Increase in blood pressure Loss of appetite with weight loss Low sodium level--muscle weakness, fatigue, dizziness, headache, confusion Serotonin syndrome--irritability, confusion, fast or irregular heartbeat, muscle stiffness, twitching muscles, sweating, high fever, seizures, chills, vomiting, diarrhea Sudden eye pain or change in vision such as blurry vision, seeing halos around lights, vision loss Thoughts of suicide or self-harm, worsening mood, feelings of depression Side effects that usually do not require medical attention (report to your care team if they continue or are bothersome): Anxiety, nervousness Change in sex drive or performance Dizziness Dry mouth Excessive sweating Nausea Tremors or shaking Trouble sleeping This list may not describe all possible side effects. Call your doctor for medical advice about side effects. You may report side effects to FDA at 1-800-FDA-1088. Where should I keep my medication? Keep out of the reach of children and pets. Store at a controlled temperature between 20 and 25 degrees C (68 degrees and 77 degrees F), in a dry place. Throw away any unused medication after the expiration date. NOTE: This sheet is a summary. It may not cover all possible information. If you have questions about this medicine, talk to your doctor, pharmacist, or health care provider.  2024 Elsevier/Gold Standard (2022-05-27 00:00:00)Fezolinetant Tablets What is this medication? FEZOLINETANT (FEZ oh LIN e tant) reduces the number and severity of hot flashes due to menopause. It works by blocking substances in your body that cause hot flashes and night  sweats. This medicine may be used for other purposes; ask your health care  provider or pharmacist if you have questions. COMMON BRAND NAME(S): VEOZAH What should I tell my care team before I take this medication? They need to know if you have any of these conditions: Kidney disease Liver disease An unusual or allergic reaction to fezolinetant, other medications, foods, dyes, or preservatives Pregnant or trying to get pregnant Breastfeeding How should I use this medication? Take this medication by mouth with water. Take it as directed on the prescription label at the same time every day. Do not cut, crush, or chew this medication. Swallow the tablets whole. You can take it with or without food. If it upsets your stomach, take it with food. Keep taking it unless your care team tells you to stop. Talk to your care team about the use of this medication in children. Special care may be needed. Overdosage: If you think you have taken too much of this medicine contact a poison control center or emergency room at once. NOTE: This medicine is only for you. Do not share this medicine with others. What if I miss a dose? If you miss a dose, take it as soon as you can unless it is more than 12 hours late. If it is more than 12 hours late, skip the missed dose. Take the next dose at the normal time. What may interact with this medication? Other medications may affect the way this medication works. Talk with your care team about all of the medications you take. They may suggest changes to your treatment plan to lower the risk of side effects and to make sure your medications work as intended. This list may not describe all possible interactions. Give your health care provider a list of all the medicines, herbs, non-prescription drugs, or dietary supplements you use. Also tell them if you smoke, drink alcohol, or use illegal drugs. Some items may interact with your medicine. What should I watch for while using this medication? Visit your care team for regular checks on your progress.  Tell your care team if your symptoms do not start to get better or if they get worse. You may need blood work while taking this medication. What side effects may I notice from receiving this medication? Side effects that you should report to your care team as soon as possible: Allergic reactions--skin rash, itching, hives, swelling of the face, lips, tongue, or throat Liver injury--right upper belly pain, loss of appetite, nausea, light-colored stool, dark yellow or brown urine, yellowing skin or eyes, unusual weakness or fatigue Side effects that usually do not require medical attention (report these to your care team if they continue or are bothersome): Back pain Diarrhea Hot flashes Stomach pain Trouble sleeping This list may not describe all possible side effects. Call your doctor for medical advice about side effects. You may report side effects to FDA at 1-800-FDA-1088. Where should I keep my medication? Keep out of the reach of children and pets. Store at room temperature between 20 and 25 degrees C (68 and 77 degrees F). Get rid of any unused medication after the expiration date. To get rid of medications that are no longer needed or have expired: Take the medication to a medication take-back program. Check with your pharmacy or law enforcement to find a location. If you cannot return the medication, check the label or package insert to see if the medication should be thrown out in the garbage or flushed down  the toilet. If you are not sure, ask your care team. If it is safe to put it in the trash, take the medication out of the container. Mix the medication with cat litter, dirt, coffee grounds, or other unwanted substance. Seal the mixture in a bag or container. Put it in the trash. NOTE: This sheet is a summary. It may not cover all possible information. If you have questions about this medicine, talk to your doctor, pharmacist, or health care provider.  2024 Elsevier/Gold Standard  (2021-11-05 00:00:00)

## 2023-11-17 NOTE — Progress Notes (Unsigned)
 SURVIVORSHIP VISIT:  BRIEF ONCOLOGIC HISTORY:  Oncology History  Malignant neoplasm of upper-outer quadrant of left breast in female, estrogen receptor positive (HCC)  04/08/2023 Initial Diagnosis   Screening mammogram detected left breast 2 masses total measuring 2.6 cm (1.5 cm and 1.1 cm) biopsy revealed grade 1 IDC ER 100% PR 100% HER2 negative, Ki-67 5%   04/20/2023 Cancer Staging   Staging form: Breast, AJCC 8th Edition - Clinical stage from 04/20/2023: Stage IA (cT1c, cN0, cM0, G1, ER+, PR+, HER2-) - Signed by Bettejane Brownie, PA-C on 04/20/2023 Stage prefix: Initial diagnosis Histologic grading system: 3 grade system   05/02/2023 Genetic Testing   Negative [facility] CancerNext+RNAinsight Panel.  Report date is 05/02/2023.   The [facility] CancerNext+RNAinsight Panel includes sequencing, rearrangement analysis, and RNA analysis for the following 34 genes: APC, ATM, BARD1, BMPR1A, BRCA1, BRCA2, BRIP1, CDH1, CDK4, CDKN2A, CHEK2, DICER1, MLH1, MSH2, MSH6, MUTYH, NF1, NTHL1, PALB2, PMS2, PTEN, RAD51C, RAD51D, SMAD4, SMARCA4, STK11 and TP53 (sequencing and deletion/duplication); AXIN2, HOXB13, MSH3, POLD1 and POLE (sequencing only); EPCAM and GREM1 (deletion/duplication only).    05/31/2023 Cancer Staging   Staging form: Breast, AJCC 8th Edition - Pathologic: Stage IA (pT2, pN1a, cM0, G1, ER+, PR+, HER2-, Oncotype DX score: 20) - Signed by Cameron Cea, MD on 05/31/2023 Multigene prognostic tests performed: Oncotype DX Recurrence score range: Greater than or equal to 11 Histologic grading system: 3 grade system   06/16/2023 - 08/03/2023 Radiation Therapy   Plan Name: Breast_L_BH Site: Breast, Left Technique: 3D Mode: Photon Dose Per Fraction: 1.8 Gy Prescribed Dose (Delivered / Prescribed): 50.4 Gy / 50.4 Gy Prescribed Fxs (Delivered / Prescribed): 28 / 28   Plan Name: SCV_L_Brst_BH Site: Sclav-LT Technique: 3D Mode: Photon Dose Per Fraction: 1.8 Gy Prescribed Dose (Delivered /  Prescribed): 50.4 Gy / 50.4 Gy Prescribed Fxs (Delivered / Prescribed): 28 / 28   Plan Name: Brst_L_Bst_BH Site: Breast, Left Technique: 3D Mode: Photon Dose Per Fraction: 2 Gy Prescribed Dose (Delivered / Prescribed): 10 Gy / 10 Gy Prescribed Fxs (Delivered / Prescribed): 5 / 5   08/2023 -  Anti-estrogen oral therapy   Tamoxifen      INTERVAL HISTORY:  Ms. Min to review her survivorship care plan detailing her treatment course for breast cancer, as well as monitoring long-term side effects of that treatment, education regarding health maintenance, screening, and overall wellness and health promotion.     Overall, Ms. Leinweber reports feeling quite well.  She is taking tamoxifen  daily and reports increased hot flashes and night sweats.  She is a current smoker of 36 pack years (1ppd x 36 years).  She is not ready to quit but is open to learning more about quitting.  She has recently cut back her tobacco use as well.    REVIEW OF SYSTEMS:  Review of Systems  Constitutional:  Negative for appetite change, chills, fatigue, fever and unexpected weight change.  HENT:   Negative for hearing loss, lump/mass and trouble swallowing.   Eyes:  Negative for eye problems and icterus.  Respiratory:  Negative for chest tightness, cough and shortness of breath.   Cardiovascular:  Negative for chest pain, leg swelling and palpitations.  Gastrointestinal:  Negative for abdominal distention, abdominal pain, constipation, diarrhea, nausea and vomiting.  Endocrine: Positive for hot flashes.  Genitourinary:  Negative for difficulty urinating.   Musculoskeletal:  Negative for arthralgias.  Skin:  Negative for itching and rash.  Neurological:  Negative for dizziness, extremity weakness, headaches and numbness.  Hematological:  Negative for  adenopathy. Does not bruise/bleed easily.  Psychiatric/Behavioral:  Negative for depression. The patient is not nervous/anxious.   Breast: Denies any new nodularity,  masses, tenderness, nipple changes, or nipple discharge.       PAST MEDICAL/SURGICAL HISTORY:  Past Medical History:  Diagnosis Date   Abnormal Pap smear of cervix    ADHD (attention deficit hyperactivity disorder)    Arthritis    hips and back   Cancer of left breast (HCC)    Cervical disc disease    SVT (supraventricular tachycardia) (HCC)    Past Surgical History:  Procedure Laterality Date   AXILLARY LYMPH NODE BIOPSY Left 05/04/2023   Procedure: LEFT AXILLARY SENTINEL LYMPH NODE BIOPSY;  Surgeon: Enid Harry, MD;  Location: [facility] OR;  Service: General;  Laterality: Left;   BREAST BIOPSY Left 04/08/2023   US  LT BREAST BX W LOC DEV 1ST LESION IMG BX SPEC US  GUIDE 04/08/2023 [facility] MAMMOGRAPHY   BREAST BIOPSY  05/03/2023   MM LT RADIOACTIVE SEED LOC MAMMO GUIDE 05/03/2023 [facility] MAMMOGRAPHY   BREAST LUMPECTOMY WITH RADIOACTIVE SEED LOCALIZATION Left 05/04/2023   Procedure: LEFT BREAST SEED BRACKETED LUMPECTOMY;  Surgeon: Enid Harry, MD;  Location: [facility] OR;  Service: General;  Laterality: Left;   CESAREAN SECTION  1991   DILATATION & CURRETTAGE/HYSTEROSCOPY WITH RESECTOCOPE N/A 10/27/2023   Procedure: HYSTEROSCOPY;  Surgeon: Cyd Dowse, MD;  Location: [facility] OR;  Service: Gynecology;  Laterality: N/A;  Myosure   MYOSURE RESECTION N/A 10/27/2023   Procedure: Almeda Jacobs RESECTION OF MYOMA;  Surgeon: Cyd Dowse, MD;  Location: [facility] OR;  Service: Gynecology;  Laterality: N/A;   NECK SURGERY  9/14   [facility]     ALLERGIES:  No Known Allergies   CURRENT MEDICATIONS:  Outpatient Encounter Medications as of 11/17/2023  Medication Sig Note   amphetamine-dextroamphetamine (ADDERALL) 20 MG tablet Take 20 mg by mouth 3 (three) times daily.    betamethasone dipropionate 0.05 % cream Apply 1 Application topically daily as needed (skin irritation/psoriasis).    Cholecalciferol (VITAMIN D3) 50 MCG (2000 UT) TABS Take 2,000 Units by mouth in the morning.    famotidine (PEPCID) 20 MG  tablet Take 20 mg by mouth in the morning.    ibuprofen  (ADVIL ) 600 MG tablet Take 1 tablet (600 mg total) by mouth every 6 (six) hours as needed.    loratadine (CLARITIN) 10 MG tablet Take 10 mg by mouth in the morning.    metoprolol  tartrate (LOPRESSOR ) 25 MG tablet Take 1 tablet (25 mg total) by mouth once as needed for up to 1 dose. Take this tab as needed if you feel palpitations and your heart rate is elevated 10/27/2023: As needed    montelukast (SINGULAIR) 10 MG tablet Take 10 mg by mouth in the morning.    Probiotic Product (PROBIOTIC PO) Take 1 capsule by mouth in the morning.    tamoxifen  (NOLVADEX ) 20 MG tablet Take 1 tablet (20 mg total) by mouth daily.    Tapinarof (VTAMA EX) Apply 1 Application topically daily.    VENTOLIN HFA 108 (90 BASE) MCG/ACT inhaler Inhale 2 puffs into the lungs every 4 (four) hours as needed for wheezing.    No facility-administered encounter medications on file as of 11/17/2023.     ONCOLOGIC FAMILY HISTORY:  Family History  Problem Relation Age of Onset   Osteoarthritis Mother    Heart attack Father 50       Died of MI   Colon cancer Maternal Aunt  dx >50   Colon cancer Maternal Aunt        dx <50   Throat cancer Paternal Uncle        dx >50; or lung?   Breast cancer Maternal Grandmother        dx 72s   Lung cancer Paternal Grandfather        dx >50     SOCIAL HISTORY:  Social History   Socioeconomic History   Marital status: Married    Spouse name: Not on file   Number of children: 1   Years of education: Not on file   Highest education level: Not on file  Occupational History   Not on file  Tobacco Use   Smoking status: Every Day    Current packs/day: 0.50    Average packs/day: 0.5 packs/day for 30.0 years (15.0 ttl pk-yrs)    Types: Cigarettes   Smokeless tobacco: Never  Vaping Use   Vaping status: Never Used  Substance and Sexual Activity   Alcohol use: Yes    Alcohol/week: 5.0 - 7.0 standard drinks of alcohol     Types: 5 - 7 Standard drinks or equivalent per week    Comment: 1-2 glasses of wine at night   Drug use: No   Sexual activity: Yes    Partners: Male  Other Topics Concern   Not on file  Social History Narrative   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     Social Drivers of Corporate investment banker Strain: Not on file  Food Insecurity: No Food Insecurity (04/20/2023)   Hunger Vital Sign    Worried About Running Out of Food in the Last Year: Never true    Ran Out of Food in the Last Year: Never true  Transportation Needs: No Transportation Needs (04/20/2023)   PRAPARE - Administrator, Civil Service (Medical): No    Lack of Transportation (Non-Medical): No  Physical Activity: Not on file  Stress: Not on file  Social Connections: Not on file  Intimate Partner Violence: Not At Risk (04/20/2023)   Humiliation, Afraid, Rape, and Kick questionnaire    Fear of Current or Ex-Partner: No    Emotionally Abused: No    Physically Abused: No    Sexually Abused: No     OBSERVATIONS/OBJECTIVE:  LMP 02/13/2023 (Approximate)  GENERAL: Patient is a well appearing female in no acute distress HEENT:  Sclerae anicteric.  Oropharynx clear and moist. No ulcerations or evidence of oropharyngeal candidiasis. Neck is supple.  NODES:  No cervical, supraclavicular, or axillary lymphadenopathy palpated.  BREAST EXAM:  Deferred. LUNGS:  Clear to auscultation bilaterally.  No wheezes or rhonchi. HEART:  Regular rate and rhythm. No murmur appreciated. ABDOMEN:  Soft, nontender.  Positive, normoactive bowel sounds. No organomegaly palpated. MSK:  No focal spinal tenderness to palpation. Full range of motion bilaterally in the upper extremities. EXTREMITIES:  No peripheral edema.   SKIN:  Clear with no obvious rashes or skin changes. No nail dyscrasia. NEURO:  Nonfocal. Well oriented.  Appropriate affect.   LABORATORY DATA:  None for this visit.  DIAGNOSTIC IMAGING:  None  for this visit.      ASSESSMENT AND PLAN:  Ms.. Sacco is a pleasant 53 y.o. female with Stage IA left breast invasive ductal carcinoma, ER+/PR+/HER2-, diagnosed in 03/2023, treated with lumpectomy, adjuvant radiation therapy, and anti-estrogen therapy with Tamoxifen  beginning in 08/2023.  She presents to the Survivorship Clinic for our initial  meeting and routine follow-up post-completion of treatment for breast cancer.    1. Stage IA left breast cancer:  Ms. Slider is continuing to recover from definitive treatment for breast cancer. She will follow-up with her medical oncologist, Dr.  Lee Public in 6 months with history and physical exam per surveillance protocol.  She will continue her anti-estrogen therapy with Tamoxifen . Thus far, she is tolerating the Tamoxifen  well, see #2. Her mammogram is due 03/2024; orders placed today.   Today, a comprehensive survivorship care plan and treatment summary was reviewed with the patient today detailing her breast cancer diagnosis, treatment course, potential late/long-term effects of treatment, appropriate follow-up care with recommendations for the future, and patient education resources.  A copy of this summary, along with a letter will be sent to the patient's primary care provider via mail/fax/In Basket message after today's visit.    2. Hot flashes: We discussed changing the time of day that she takes the Tamoxifen , which may help.  We also discussed use of Effexor or Veozah to help alleviate the hot flashes.  Seh is going to consider next steps and reach out to me if she would like to try Effexor.    3. Bone health:  She was given education on specific activities to promote bone health.  4. Cancer screening:  Due to Ms. Verbeke's history and her age, she should receive screening for skin cancers, colon cancer, and gynecologic cancers.  The information and recommendations are listed on the patient's comprehensive care plan/treatment summary and were  reviewed in detail with the patient.    *Lung cancer screening:  I discussed screening Low Dose Ct chest without contrast for early detection of lung cancer in this Counseling and Shared Decision-Making Visit Patient ZULEYKA KLOC with 31-Jan-1971 and Age 12 y.o. years met the following criteria and I counseled that in  A) age 52-75 AND B) smoking history of 20 pack year smoking AND C) Current smoker or one who has quit smoking within the last 15 years I counseled - Annual low dose CT chest can pick up lung cancer early and has potential to save lives and cure lung cancer - This is similar in concept to screening mammogram, colonoscopies and pap smears - I explained Ct scan chest is low dose radiation CT chest - I explained early lung cancer asymptomatic and only way to  detect is CT  With the real advantage that early lung cancer is curable through radiation or surgery - I explained CT superior to CXR - I explained that false positives are present and can incur cost and workup like biopsies, additional scan but benefit outweighs risk - I counseled that patient should QUIT SMOKING and I gave her information on the [place] on demand quit smoking appointments available  - I counseled that patient should adhered to protocol requirements of scan and followup scans  5. Health maintenance and wellness promotion: Ms. Schweers was encouraged to consume 5-7 servings of fruits and vegetables per day. We reviewed the "Nutrition Rainbow" handout.  She was also encouraged to engage in moderate to vigorous exercise for 30 minutes per day most days of the week.  She was instructed to limit her alcohol consumption and was encouraged stop smoking--She was given information on [facility] quit smoking on demand appointments.    6. Support services/counseling: It is not uncommon for this period of the patient's cancer care trajectory to be one of many emotions and stressors.   She was given information  regarding our available services and encouraged to contact me with any questions or for help enrolling in any of our support group/programs.    Follow up instructions:    -Return to cancer center in 6 months for f/u with Dr. Lee Public -Mammogram due in 03/2024 -CT lung cancer screening  -F/u with [facility] in 1 year for survivorship and lung cancer screening  -She is welcome to return back to the Survivorship Clinic at any time; no additional follow-up needed at this time.  -Consider referral back to survivorship as a long-term survivor for continued surveillance  The patient was provided an opportunity to ask questions and all were answered. The patient agreed with the plan and demonstrated an understanding of the instructions.   Total encounter time:60 minutes*in face-to-face visit time, chart review, lab review, care coordination, order entry, and documentation of the encounter time.    Alwin Baars, NP 11/17/23 7:59 AM Medical Oncology and Hematology [facility] [address] [place], [place] [address] Tel. [phone]    Fax. [phone]  *Total Encounter Time as defined by the Centers for Medicare and Medicaid Services includes, in addition to the face-to-face time of a patient visit (documented in the note above) non-face-to-face time: obtaining and reviewing outside history, ordering and reviewing medications, tests or procedures, care coordination (communications with other health care professionals or caregivers) and documentation in the medical record.

## 2023-12-02 ENCOUNTER — Other Ambulatory Visit: Payer: Self-pay | Admitting: *Deleted

## 2023-12-02 MED ORDER — VENLAFAXINE HCL ER 37.5 MG PO CP24: 37.5000 mg | ORAL_CAPSULE | Freq: Every day | ORAL | 0 refills | Status: DC

## 2023-12-02 NOTE — Progress Notes (Signed)
 Received call from pt stating she would like to proceed with Effexor for hot flashes.  Verbal orders received and placed per NP request for pt to start Effexor 37.5 mg p.o daily and f/u with our office in 4 weeks.  Pt educated and verbalized understanding.

## 2023-12-08 ENCOUNTER — Ambulatory Visit (HOSPITAL_COMMUNITY)
Admission: RE | Admit: 2023-12-08 | Discharge: 2023-12-08 | Disposition: A | Discharge status: Home / Self Care | Source: Ambulatory Visit | Attending: Adult Health | Admitting: Adult Health

## 2023-12-08 DIAGNOSIS — F17210 Nicotine dependence, cigarettes, uncomplicated: Secondary | ICD-10-CM | POA: Diagnosis present

## 2023-12-08 DIAGNOSIS — Z122 Encounter for screening for malignant neoplasm of respiratory organs: Secondary | ICD-10-CM | POA: Insufficient documentation

## 2023-12-15 ENCOUNTER — Ambulatory Visit: Payer: Self-pay | Admitting: *Deleted

## 2023-12-23 ENCOUNTER — Encounter: Payer: Self-pay | Admitting: Adult Health

## 2024-01-03 ENCOUNTER — Inpatient Hospital Stay: Attending: Adult Health | Admitting: Adult Health

## 2024-01-03 ENCOUNTER — Encounter: Payer: Self-pay | Admitting: Adult Health

## 2024-01-03 DIAGNOSIS — Z1732 Human epidermal growth factor receptor 2 negative status: Secondary | ICD-10-CM

## 2024-01-03 DIAGNOSIS — C50412 Malignant neoplasm of upper-outer quadrant of left female breast: Secondary | ICD-10-CM

## 2024-01-03 DIAGNOSIS — Z170 Estrogen receptor positive status [ER+]: Secondary | ICD-10-CM

## 2024-01-03 DIAGNOSIS — Z1721 Progesterone receptor positive status: Secondary | ICD-10-CM | POA: Diagnosis not present

## 2024-01-03 MED ORDER — VENLAFAXINE HCL ER 37.5 MG PO CP24
37.5000 mg | ORAL_CAPSULE | Freq: Every day | ORAL | 3 refills | Status: DC
Start: 2024-01-03 — End: 2024-05-03

## 2024-01-03 NOTE — Assessment & Plan Note (Signed)
 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 daily.  This is significantly improved her hot flashes.  She will continue this.  Eligible for lung cancer screening: Low-dose screening CT completed in June was negative.  She will see Dr. Gudena for her breast cancer follow-up in December and she will see me back in June for lung cancer screening follow-up and lifestyle follow-up.

## 2024-01-03 NOTE — Progress Notes (Signed)
 [facility] Cancer Follow up:    Heidi Prentice SAUNDERS, FNP [phone] [address] [place] [address]   DIAGNOSIS:  Cancer Staging  Malignant neoplasm of upper-outer quadrant of left breast in female, estrogen receptor positive (HCC) Staging form: Breast, AJCC 8th Edition - Clinical stage from 04/20/2023: Stage IA (cT1c, cN0, cM0, G1, ER+, PR+, HER2-) - Signed by Lanell Donald Stagger, PA-C on 04/20/2023 Stage prefix: Initial diagnosis Histologic grading system: 3 grade system - Pathologic: Stage IA (pT2, pN1a, cM0, G1, ER+, PR+, HER2-, Oncotype DX score: 20) - Signed by Odean Potts, MD on 05/31/2023 Multigene prognostic tests performed: Oncotype DX Recurrence score range: Greater than or equal to 11 Histologic grading system: 3 grade system  I connected with Heidi Nolan on 01/03/24 at  8:40 AM EDT by telephone and verified that I am speaking with the correct person using two identifiers.  I discussed the limitations, risks, security and privacy concerns of performing an evaluation and management service by telephone and the availability of in person appointments.  I also discussed with the patient that there may be a patient responsible charge related to this service. The patient expressed understanding and agreed to proceed.  Patient location: Home Provider location: [facility] office    SUMMARY OF ONCOLOGIC HISTORY: Oncology History  Malignant neoplasm of upper-outer quadrant of left breast in female, estrogen receptor positive (HCC)  04/08/2023 Initial Diagnosis   Screening mammogram detected left breast 2 masses total measuring 2.6 cm (1.5 cm and 1.1 cm) biopsy revealed grade 1 IDC ER 100% PR 100% HER2 negative, Ki-67 5%   04/20/2023 Cancer Staging   Staging form: Breast, AJCC 8th Edition - Clinical stage from 04/20/2023: Stage IA (cT1c, cN0, cM0, G1, ER+, PR+, HER2-) - Signed by Lanell Donald Stagger, PA-C on 04/20/2023 Stage prefix: Initial  diagnosis Histologic grading system: 3 grade system   05/02/2023 Genetic Testing   Negative [facility] CancerNext+RNAinsight Panel.  Report date is 05/02/2023.   The [facility] CancerNext+RNAinsight Panel includes sequencing, rearrangement analysis, and RNA analysis for the following 34 genes: APC, ATM, BARD1, BMPR1A, BRCA1, BRCA2, BRIP1, CDH1, CDK4, CDKN2A, CHEK2, DICER1, MLH1, MSH2, MSH6, MUTYH, NF1, NTHL1, PALB2, PMS2, PTEN, RAD51C, RAD51D, SMAD4, SMARCA4, STK11 and TP53 (sequencing and deletion/duplication); AXIN2, HOXB13, MSH3, POLD1 and POLE (sequencing only); EPCAM and GREM1 (deletion/duplication only).    05/04/2023 Surgery   Left lumpectomy: Grade 1 IDC 2.1 cm with DCIS, margins negative, LVI not identified, 1/2 lymph nodes positive with extracapsular extension, ER 60%, PR 70%, HER2 negative, Ki-67 5%    05/04/2023 Oncotype testing   20/9%   05/31/2023 Cancer Staging   Staging form: Breast, AJCC 8th Edition - Pathologic: Stage IA (pT2, pN1a, cM0, G1, ER+, PR+, HER2-, Oncotype DX score: 20) - Signed by Odean Potts, MD on 05/31/2023 Multigene prognostic tests performed: Oncotype DX Recurrence score range: Greater than or equal to 11 Histologic grading system: 3 grade system   06/16/2023 - 08/03/2023 Radiation Therapy   Plan Name: Breast_L_BH Site: Breast, Left Technique: 3D Mode: Photon Dose Per Fraction: 1.8 Gy Prescribed Dose (Delivered / Prescribed): 50.4 Gy / 50.4 Gy Prescribed Fxs (Delivered / Prescribed): 28 / 28   Plan Name: SCV_L_Brst_BH Site: Sclav-LT Technique: 3D Mode: Photon Dose Per Fraction: 1.8 Gy Prescribed Dose (Delivered / Prescribed): 50.4 Gy / 50.4 Gy Prescribed Fxs (Delivered / Prescribed): 28 / 28   Plan Name: Brst_L_Bst_BH Site: Breast, Left Technique: 3D Mode: Photon Dose Per Fraction: 2 Gy Prescribed Dose (Delivered /  Prescribed): 10 Gy / 10 Gy Prescribed Fxs (Delivered / Prescribed): 5 / 5   08/2023 -  Anti-estrogen oral therapy   Tamoxifen       CURRENT THERAPY: Tamoxifen   INTERVAL HISTORY:   Heidi Nolan is a 53 year old woman who is on tamoxifen  daily.  She has been struggling with hot flashes while taking the tamoxifen .  She was prescribed Effexor  on June 20 and has taken almost the entire month supply and notes a significant improvement in her hot flashes.  She reports improved quality of life because she is able to sleep more effectively without experiencing the hot flashes at night.  She denies any side effects from the Effexor  such as headaches nausea suicidal thoughts.  She is feeling much improved.  Of note she meets criteria for lung cancer screening and underwent this last month which was negative.   Patient Active Problem List   Diagnosis Date Noted   Genetic testing 05/02/2023   Malignant neoplasm of upper-outer quadrant of left breast in female, estrogen receptor positive (HCC) 04/18/2023   HSV (herpes simplex virus) anogenital infection 09/01/2014   Rectal fissure 09/01/2014   Cervical spinal stenosis 03/01/2013    has no known allergies.  MEDICAL HISTORY: Past Medical History:  Diagnosis Date   Abnormal Pap smear of cervix    ADHD (attention deficit hyperactivity disorder)    Arthritis    hips and back   Cancer of left breast (HCC)    Cervical disc disease    SVT (supraventricular tachycardia) (HCC)     SURGICAL HISTORY: Past Surgical History:  Procedure Laterality Date   AXILLARY LYMPH NODE BIOPSY Left 05/04/2023   Procedure: LEFT AXILLARY SENTINEL LYMPH NODE BIOPSY;  Surgeon: Ebbie Cough, MD;  Location[facility] OR;  Service: General;  Laterality: Left;   BREAST BIOPSY Left 04/08/2023   US  LT BREAST BX W LOC DEV 1ST LESION IMG BX SPEC US  GUIDE 04/08/2023 [facility] MAMMOGRAPHY   BREAST BIOPSY  05/03/2023   MM LT RADIOACTIVE SEED LOC MAMMO GUIDE 05/03/2023 [facility] MAMMOGRAPHY   BREAST LUMPECTOMY WITH RADIOACTIVE SEED LOCALIZATION Left 05/04/2023   Procedure: LEFT BREAST SEED BRACKETED LUMPECTOMY;   Surgeon: Ebbie Cough, MD;  Location: [facility] OR;  Service: General;  Laterality: Left;   CESAREAN SECTION  1991   DILATATION & CURRETTAGE/HYSTEROSCOPY WITH RESECTOCOPE N/A 10/27/2023   Procedure: HYSTEROSCOPY;  Surgeon: Horacio Boas, MD;  Location: [facility] OR;  Service: Gynecology;  Laterality: N/A;  Myosure   MYOSURE RESECTION N/A 10/27/2023   Procedure: MELINDA RESECTION OF MYOMA;  Surgeon: Horacio Boas, MD;  Location: [facility] OR;  Service: Gynecology;  Laterality: N/A;   NECK SURGERY  9/14   [facility]    SOCIAL HISTORY: Social History   Socioeconomic History   Marital status: Married    Spouse name: Not on file   Number of children: 1   Years of education: Not on file   Highest education level: Not on file  Occupational History   Not on file  Tobacco Use   Smoking status: Every Day    Current packs/day: 0.50    Average packs/day: 0.5 packs/day for 30.0 years (15.0 ttl pk-yrs)    Types: Cigarettes   Smokeless tobacco: Never  Vaping Use   Vaping status: Never Used  Substance and Sexual Activity   Alcohol use: Yes    Alcohol/week: 5.0 - 7.0 standard drinks of alcohol    Types: 5 - 7 Standard drinks or equivalent per week    Comment: 1-2 glasses of wine at night  Drug use: No   Sexual activity: Yes    Partners: Male  Other Topics Concern   Not on file  Social History Narrative   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     Social Drivers of Corporate investment banker Strain: Not on file  Food Insecurity: No Food Insecurity (04/20/2023)   Hunger Vital Sign    Worried About Running Out of Food in the Last Year: Never true    Ran Out of Food in the Last Year: Never true  Transportation Needs: No Transportation Needs (04/20/2023)   PRAPARE - Administrator, Civil Service (Medical): No    Lack of Transportation (Non-Medical): No  Physical Activity: Not on file  Stress: Not on file  Social Connections: Not on file  Intimate Partner Violence: Not At Risk  (04/20/2023)   Humiliation, Afraid, Rape, and Kick questionnaire    Fear of Current or Ex-Partner: No    Emotionally Abused: No    Physically Abused: No    Sexually Abused: No    FAMILY HISTORY: Family History  Problem Relation Age of Onset   Osteoarthritis Mother    Heart attack Father 51       Died of MI   Colon cancer Maternal Aunt        dx >50   Colon cancer Maternal Aunt        dx <50   Throat cancer Paternal Uncle        dx >50; or lung?   Breast cancer Maternal Grandmother        dx 82s   Lung cancer Paternal Grandfather        dx >50    Review of Systems  Constitutional:  Negative for appetite change, chills, fatigue, fever and unexpected weight change.  HENT:   Negative for hearing loss, lump/mass and trouble swallowing.   Eyes:  Negative for eye problems and icterus.  Respiratory:  Negative for chest tightness, cough and shortness of breath.   Cardiovascular:  Negative for chest pain, leg swelling and palpitations.  Gastrointestinal:  Negative for abdominal distention, abdominal pain, constipation, diarrhea, nausea and vomiting.  Endocrine: Negative for hot flashes.  Genitourinary:  Negative for difficulty urinating.   Musculoskeletal:  Negative for arthralgias.  Skin:  Negative for itching and rash.  Neurological:  Negative for dizziness, extremity weakness, headaches and numbness.  Hematological:  Negative for adenopathy. Does not bruise/bleed easily.  Psychiatric/Behavioral:  Negative for depression. The patient is not nervous/anxious.       PHYSICAL EXAMINATION Patient sounds well.  In no apparent distress.  Mood and behavior are normal.  Breathing is nonlabored.   LABORATORY DATA:  CBC    Component Value Date/Time   WBC 6.0 10/27/2023 0740   RBC 3.87 10/27/2023 0740   HGB 12.5 10/27/2023 0740   HGB 13.7 04/20/2023 1205   HGB 12.6 08/30/2012 1548   HGB 12.6 08/30/2012 1447   HCT 37.1 10/27/2023 0740   PLT 300 10/27/2023 0740   PLT 377  04/20/2023 1205   MCV 95.9 10/27/2023 0740   MCH 32.3 10/27/2023 0740   MCHC 33.7 10/27/2023 0740   RDW 14.3 10/27/2023 0740   LYMPHSABS 2.6 04/20/2023 1205   MONOABS 0.6 04/20/2023 1205   EOSABS 0.1 04/20/2023 1205   BASOSABS 0.1 04/20/2023 1205    CMP     Component Value Date/Time   NA 137 04/20/2023 1205   NA 142 01/23/2021 0829  K 4.0 04/20/2023 1205   CL 102 04/20/2023 1205   CO2 31 04/20/2023 1205   GLUCOSE 95 04/20/2023 1205   BUN 21 (H) 04/20/2023 1205   BUN 10 01/23/2021 0829   CREATININE 0.54 04/20/2023 1205   CALCIUM 9.4 04/20/2023 1205   PROT 7.0 04/20/2023 1205   PROT 6.9 01/23/2021 0829   ALBUMIN 4.5 04/20/2023 1205   ALBUMIN 4.7 01/23/2021 0829   AST 17 04/20/2023 1205   ALT 19 04/20/2023 1205   ALKPHOS 51 04/20/2023 1205   BILITOT 0.5 04/20/2023 1205   GFRNONAA >60 04/20/2023 1205   GFRAA >60 03/28/2018 1135     ASSESSMENT and THERAPY PLAN:   Malignant neoplasm of upper-outer quadrant of left breast in female, estrogen receptor positive (HCC) 04/08/2023:Screening mammogram detected left breast 2 masses total measuring 2.6 cm (1.5 cm and 1.1 cm) biopsy revealed grade 1 IDC ER 100% PR 100% HER2 negative, Ki-67 5%  05/04/2023: Left lumpectomy: Grade 1 IDC 2.1 cm with DCIS, margins negative, LVI not identified, 1/2 lymph nodes positive with extracapsular extension, ER 60%, PR 70%, HER2 negative, Ki-67 5% Oncotype DX recurrence score: 20    Treatment plan: Oncotype DX: 20 (no role of chemotherapy) Adjuvant radiation therapy 06/17/23-08/03/23 Adjuvant antiestrogen therapy --------------------------------------------------------------------------------------------------------------------  Tamoxifen  counseling:  Hot flashes: On Effexor  37.5 mg p.o. daily.  This is significantly improved her hot flashes.  She will continue this.  Eligible for lung cancer screening: Low-dose screening CT completed in June was negative.  She will see Dr. Gudena for her breast  cancer follow-up in December and she will see me back in June for lung cancer screening follow-up and lifestyle follow-up.      The patient was provided an opportunity to ask questions and all were answered. The patient agreed with the plan and demonstrated an understanding of the instructions.   The patient was advised to call back or seek an in-person evaluation if the symptoms worsen or if the condition fails to improve as anticipated.   I provided 10 minutes of non face-to-face telephone visit time during this encounter, and > 50% was spent counseling as documented under my assessment & plan.   Morna Kendall, NP 01/03/24 9:05 AM Medical Oncology and Hematology [facility] [address] [place], [place] [address] Tel. [phone]    Fax. [phone]  *Total Encounter Time as defined by the Centers for Medicare and Medicaid Services includes, in addition to the face-to-face time of a patient visit (documented in the note above) non-face-to-face time: obtaining and reviewing outside history, ordering and reviewing medications, tests or procedures, care coordination (communications with other health care professionals or caregivers) and documentation in the medical record.

## 2024-02-06 ENCOUNTER — Ambulatory Visit: Attending: General Surgery

## 2024-02-06 VITALS — Wt 151.4 lb

## 2024-02-06 DIAGNOSIS — Z483 Aftercare following surgery for neoplasm: Secondary | ICD-10-CM | POA: Insufficient documentation

## 2024-02-06 NOTE — Therapy (Signed)
 OUTPATIENT PHYSICAL THERAPY SOZO SCREENING NOTE   Patient Name: Heidi Nolan MRN: [id number] DOB:May 30, 1971, 53 y.o., female Today's Date: 02/06/2024  PCP: Marvene Prentice SAUNDERS, FNP REFERRING PROVIDER: Marvene Prentice SAUNDERS, FNP   PT End of Session - 02/06/24 1531     Visit Number 1   # unchanged due to screen only   PT Start Time 1530    PT Stop Time 1533    PT Time Calculation (min) 3 min    Activity Tolerance Patient tolerated treatment well    Behavior During Therapy WFL for tasks assessed/performed          Past Medical History:  Diagnosis Date   Abnormal Pap smear of cervix    ADHD (attention deficit hyperactivity disorder)    Arthritis    hips and back   Cancer of left breast (HCC)    Cervical disc disease    SVT (supraventricular tachycardia) (HCC)    Past Surgical History:  Procedure Laterality Date   AXILLARY LYMPH NODE BIOPSY Left 05/04/2023   Procedure: LEFT AXILLARY SENTINEL LYMPH NODE BIOPSY;  Surgeon: Ebbie Cough, MD;  Location: [facility] OR;  Service: General;  Laterality: Left;   BREAST BIOPSY Left 04/08/2023   US  LT BREAST BX W LOC DEV 1ST LESION IMG BX SPEC US  GUIDE 04/08/2023 [facility] MAMMOGRAPHY   BREAST BIOPSY  05/03/2023   MM LT RADIOACTIVE SEED LOC MAMMO GUIDE 05/03/2023 [facility] MAMMOGRAPHY   BREAST LUMPECTOMY WITH RADIOACTIVE SEED LOCALIZATION Left 05/04/2023   Procedure: LEFT BREAST SEED BRACKETED LUMPECTOMY;  Surgeon: Ebbie Cough, MD;  Location: [facility] OR;  Service: General;  Laterality: Left;   CESAREAN SECTION  1991   DILATATION & CURRETTAGE/HYSTEROSCOPY WITH RESECTOCOPE N/A 10/27/2023   Procedure: HYSTEROSCOPY;  Surgeon: Horacio Boas, MD;  Location: [facility] OR;  Service: Gynecology;  Laterality: N/A;  Myosure   MYOSURE RESECTION N/A 10/27/2023   Procedure: MELINDA RESECTION OF MYOMA;  Surgeon: Horacio Boas, MD;  Location: [facility] OR;  Service: Gynecology;  Laterality: N/A;   NECK SURGERY  9/14   [facility]   Patient Active Problem List   Diagnosis Date  Noted   Genetic testing 05/02/2023   Malignant neoplasm of upper-outer quadrant of left breast in female, estrogen receptor positive (HCC) 04/18/2023   HSV (herpes simplex virus) anogenital infection 09/01/2014   Rectal fissure 09/01/2014   Cervical spinal stenosis 03/01/2013    REFERRING DIAG: left breast cancer at risk for lymphedema  THERAPY DIAG: Aftercare following surgery for neoplasm  PERTINENT HISTORY: Patient was diagnosed on 03/09/2023 with 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 She underwent a left lumpectomy and sentinel node biopsy (1 of 2 nodes positive for carcinoma) on 05/04/2023. It is ER/PR positive and HER2 negative with a Ki67 of 5%. Completed radiation    PRECAUTIONS: left UE Lymphedema risk,   SUBJECTIVE: Here for 3 month SOZO screen  PAIN:  Are you having pain? No  SOZO SCREENING: Patient was assessed today using the SOZO machine to determine the lymphedema index score. This was compared to her baseline score. It was determined that she is within the recommended range when compared to her baseline and no further action is needed at this time. She will continue SOZO screenings. These are done every 3 months for 2 years post operatively followed by every 6 months for 2 years, and then annually.   L-DEX FLOWSHEETS - 02/06/24 1500       L-DEX LYMPHEDEMA SCREENING   Measurement Type Unilateral    L-DEX MEASUREMENT EXTREMITY  Upper Extremity    POSITION  Standing    DOMINANT SIDE Right    At Risk Side Left    BASELINE SCORE (UNILATERAL) 0.4    L-DEX SCORE (UNILATERAL) -2.3    VALUE CHANGE (UNILAT) -2.7            Aden Berwyn Caldron, PTA 02/06/2024, 3:32 PM

## 2024-02-23 NOTE — Progress Notes (Unsigned)
  Cardiology Office Note:   Date:  02/24/2024  ID:  Heidi Nolan, DOB 04/18/1971, MRN [id number] PCP: Heidi Prentice SAUNDERS, FNP  [facility] Providers Cardiologist:  None {  History of Present Illness:   Heidi Nolan is a 53 y.o. female  who was previously seen by another cardiology group.  She was seen in 2022.   Her calcium score in 2022 was zero.   Echo was normal at that time.    She was also treated for SVT.      I saw her in 2019 for chest pain.  This was thought to be nonanginal.   She has had recurrent SVT with ER visits and EMS calls.  I have reviewed EMS records and documented SVT with a rates in the 200 range.   The patient has now completed radiation therapy for breast cancer.  She wanted to wait till this was over until she considered a more permanent treatment of her SVT.  She has had a few episodes since I last saw her.  In fact a few days ago she had significant palpitations with her rate in the 130s.  She actually had to take metoprolol  4 times over a few hours.  To finally get the break.  It did not break with vagal maneuvers.     ROS: As stated in the HPI and negative for all other systems.  Studies Reviewed:    EKG:     12/12/2022 sinus rhythm, rate 72, axis within normal limits, intervals within normal limits, no acute ST-T wave changes.  Risk Assessment/Calculations:              Physical Exam:   VS:  BP 128/74   Pulse 71   Ht 5' 5 (1.651 m)   Wt 151 lb 3.2 oz (68.6 kg)   SpO2 96%   BMI 25.16 kg/m    Wt Readings from Last 3 Encounters:  02/24/24 151 lb 3.2 oz (68.6 kg)  02/06/24 151 lb 6 oz (68.7 kg)  11/17/23 147 lb (66.7 kg)     GEN: Well nourished, well developed in no acute distress NECK: No JVD; No carotid bruits CARDIAC: RRR, no murmurs, rubs, gallops RESPIRATORY:  Clear to auscultation without rales, wheezing or rhonchi  ABDOMEN: Soft, non-tender, non-distended EXTREMITIES:  No edema; No deformity   ASSESSMENT AND PLAN:   SVT:     She has had recurrent SVT and is ready to consider ablation.  She is going to get labs by her primary provider today in [place] .  TSH.  I will set her up to see EP.  No further imaging or testing is indicated.   Follow up with me as needed.  Signed, Lynwood Schilling, MD

## 2024-02-24 ENCOUNTER — Ambulatory Visit: Attending: Cardiology | Admitting: Cardiology

## 2024-02-24 ENCOUNTER — Encounter: Payer: Self-pay | Admitting: Cardiology

## 2024-02-24 VITALS — BP 128/74 | HR 71 | Ht 65.0 in | Wt 151.2 lb

## 2024-02-24 DIAGNOSIS — I471 Supraventricular tachycardia, unspecified: Secondary | ICD-10-CM

## 2024-02-24 NOTE — Patient Instructions (Signed)
 Medication Instructions:  Your physician recommends that you continue on your current medications as directed. Please refer to the Current Medication list given to you today.  *If you need a refill on your cardiac medications before your next appointment, please call your pharmacy*  Lab Work: NONE If you have labs (blood work) drawn today and your tests are completely normal, you will receive your results only by: MyChart Message (if you have MyChart) OR A paper copy in the mail If you have any lab test that is abnormal or we need to change your treatment, we will call you to review the results.  Testing/Procedures: NONE  Follow-Up: At [facility], you and your health needs are our priority.  As part of our continuing mission to provide you with exceptional heart care, our providers are all part of one team.  This team includes your primary Cardiologist (physician) and Advanced Practice Providers or APPs (Physician Assistants and Nurse Practitioners) who all work together to provide you with the care you need, when you need it.  Your next appointment:   With EP   We recommend signing up for the patient portal called MyChart.  Sign up information is provided on this After Visit Summary.  MyChart is used to connect with patients for Virtual Visits (Telemedicine).  Patients are able to view lab/test results, encounter notes, upcoming appointments, etc.  Non-urgent messages can be sent to your provider as well.   To learn more about what you can do with MyChart, go to [email or web address].   Other Instructions You have been referred to Electrophysiology team for an SVT ablation. Someone will reach out to you to schedule.

## 2024-03-29 ENCOUNTER — Ambulatory Visit: Attending: Internal Medicine | Admitting: Internal Medicine

## 2024-03-29 ENCOUNTER — Encounter: Payer: Self-pay | Admitting: Internal Medicine

## 2024-03-29 VITALS — BP 141/87 | HR 75 | Ht 65.0 in | Wt 153.0 lb

## 2024-03-29 DIAGNOSIS — Z01812 Encounter for preprocedural laboratory examination: Secondary | ICD-10-CM | POA: Diagnosis not present

## 2024-03-29 DIAGNOSIS — I471 Supraventricular tachycardia, unspecified: Secondary | ICD-10-CM

## 2024-03-29 NOTE — Progress Notes (Signed)
 HPI [name] is referred by Dr. [facility] for evaluation of SVT. She is a pleasant 53 yo woman with a h/o breast Ca, who has developed palpitations and found to have SVT over the past few years. The patient notes episodes lasting hours and has required IV adenosine  for SVT over 200/min. She has not had syncope. They stop and start suddenly. She has taken metoprolol  with variable success.  No Known Allergies   Current Outpatient Medications  Medication Sig Dispense Refill   amphetamine-dextroamphetamine (ADDERALL) 20 MG tablet Take 20 mg by mouth 3 (three) times daily.     betamethasone dipropionate 0.05 % cream Apply 1 Application topically daily as needed (skin irritation/psoriasis).     BLACK CURRANT SEED OIL PO Take 1 drop by mouth daily.     Cholecalciferol (VITAMIN D3) 50 MCG (2000 UT) TABS Take 2,000 Units by mouth in the morning.     famotidine (PEPCID) 20 MG tablet Take 20 mg by mouth in the morning.     loratadine (CLARITIN) 10 MG tablet Take 10 mg by mouth in the morning.     metoprolol  tartrate (LOPRESSOR ) 25 MG tablet Take 1 tablet (25 mg total) by mouth once as needed for up to 1 dose. Take this tab as needed if you feel palpitations and your heart rate is elevated 15 tablet 0   montelukast (SINGULAIR) 10 MG tablet Take 10 mg by mouth in the morning.  3   Multiple Vitamins-Minerals (VITAMIN-MINERAL SUPPLEMENT PO) Take 2 drops by mouth daily. [place] Tail Oil Supplement     Probiotic Product (PROBIOTIC PO) Take 1 capsule by mouth in the morning.     tamoxifen  (NOLVADEX ) 20 MG tablet Take 1 tablet (20 mg total) by mouth daily. 90 tablet 3   Tapinarof (VTAMA EX) Apply 1 Application topically daily.     venlafaxine  XR (EFFEXOR -XR) 37.5 MG 24 hr capsule Take 1 capsule (37.5 mg total) by mouth daily with breakfast. 90 capsule 3   VENTOLIN HFA 108 (90 BASE) MCG/ACT inhaler Inhale 2 puffs into the lungs every 4 (four) hours as needed for wheezing.     No current facility-administered  medications for this visit.     Past Medical History:  Diagnosis Date   Abnormal Pap smear of cervix    ADHD (attention deficit hyperactivity disorder)    Arthritis    hips and back   Cancer of left breast (HCC)    Cervical disc disease    SVT (supraventricular tachycardia)     ROS:   All systems reviewed and negative except as noted in the HPI.   Past Surgical History:  Procedure Laterality Date   AXILLARY LYMPH NODE BIOPSY Left 05/04/2023   Procedure: LEFT AXILLARY SENTINEL LYMPH NODE BIOPSY;  Surgeon: Ebbie Cough, MD;  Location: [facility] OR;  Service: General;  Laterality: Left;   BREAST BIOPSY Left 04/08/2023   US  LT BREAST BX W LOC DEV 1ST LESION IMG BX SPEC US  GUIDE 04/08/2023 [facility]   BREAST BIOPSY  05/03/2023   MM LT RADIOACTIVE SEED LOC MAMMO GUIDE 05/03/2023 [facility]   BREAST LUMPECTOMY WITH RADIOACTIVE SEED LOCALIZATION Left 05/04/2023   Procedure: LEFT BREAST SEED BRACKETED LUMPECTOMY;  Surgeon: Ebbie Cough, MD;  Location: [facility] OR;  Service: General;  Laterality: Left;   CESAREAN SECTION  1991   DILATATION & CURRETTAGE/HYSTEROSCOPY WITH RESECTOCOPE N/A 10/27/2023   Procedure: HYSTEROSCOPY;  Surgeon: Horacio Boas, MD;  Location: [facility] OR;  Service: Gynecology;  Laterality: N/A;  Myosure   MYOSURE RESECTION N/A 10/27/2023   Procedure: MELINDA RESECTION OF MYOMA;  Surgeon: Horacio Boas, MD;  Location: [facility] OR;  Service: Gynecology;  Laterality: N/A;   NECK SURGERY  9/14   [facility]     Family History  Problem Relation Age of Onset   Osteoarthritis Mother    Heart attack Father 23       Died of MI   Colon cancer Maternal Aunt        dx >50   Colon cancer Maternal Aunt        dx <50   Throat cancer Paternal Uncle        dx >50; or lung?   Breast cancer Maternal Grandmother        dx 46s   Lung cancer Paternal Grandfather        dx >50     Social History   Socioeconomic History   Marital status: Married    Spouse name: Not on  file   Number of children: 1   Years of education: Not on file   Highest education level: Not on file  Occupational History   Not on file  Tobacco Use   Smoking status: Every Day    Current packs/day: 0.50    Average packs/day: 0.5 packs/day for 30.0 years (15.0 ttl pk-yrs)    Types: Cigarettes   Smokeless tobacco: Never   Tobacco comments:    1/2 PPD  Vaping Use   Vaping status: Never Used  Substance and Sexual Activity   Alcohol use: Yes    Alcohol/week: 5.0 - 7.0 standard drinks of alcohol    Types: 5 - 7 Standard drinks or equivalent per week    Comment: 1-2 glasses of wine at night   Drug use: No   Sexual activity: Yes    Partners: Male  Other Topics Concern   Not on file  Social History Narrative   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     Social Drivers of Corporate investment banker Strain: Not on file  Food Insecurity: No Food Insecurity (04/20/2023)   Hunger Vital Sign    Worried About Running Out of Food in the Last Year: Never true    Ran Out of Food in the Last Year: Never true  Transportation Needs: No Transportation Needs (04/20/2023)   PRAPARE - Administrator, Civil Service (Medical): No    Lack of Transportation (Non-Medical): No  Physical Activity: Not on file  Stress: Not on file  Social Connections: Not on file  Intimate Partner Violence: Not At Risk (04/20/2023)   Humiliation, Afraid, Rape, and Kick questionnaire    Fear of Current or Ex-Partner: No    Emotionally Abused: No    Physically Abused: No    Sexually Abused: No     BP (!) 141/87 (BP Location: Right Arm, Patient Position: Sitting, Cuff Size: Normal)   Pulse 75   Ht 5' 5 (1.651 m)   Wt 153 lb (69.4 kg)   SpO2 98%   BMI 25.46 kg/m   Physical Exam:  Well appearing NAD HEENT: Unremarkable Neck:  No JVD, no thyromegally Lymphatics:  No adenopathy Back:  No CVA tenderness Lungs:  Clear with no wheezes HEART:  Regular rate rhythm, no murmurs, no  rubs, no clicks Abd:  soft, positive bowel sounds, no organomegally, no rebound, no guarding Ext:  2 plus pulses, no edema, no cyanosis, no clubbing Skin:  No rashes  no nodules Neuro:  CN II through XII intact, motor grossly intact  EKG - nsr with no pre-excitation   Assess/Plan: SVT - I have discussed the treatment options with the patient and the risks/benefits/goals/expectations of EP study and catheter ablation were reviewed and she wishes to proceed.   Danelle Shantrice Rodenberg,MD

## 2024-03-29 NOTE — Patient Instructions (Addendum)
 Medication Instructions:  Your physician recommends that you continue on your current medications as directed. Please refer to the Current Medication list given to you today.  *If you need a refill on your cardiac medications before your next appointment, please call your pharmacy*  Lab Work: CBC and BMP- within a month  You may go to any [facility] Location for your lab work:  [facility] - [address] ([facility]) - [address] [phone] [address]  [place] - [address]   [place]  - [address]   [place] - [address] - [address] [employer]  - [address] [place] - [address] ([facility]   If you have labs (blood work) drawn today and your tests are completely normal, you will receive your results only by: [employer] (if you have MyChart)  If you have any lab test that is abnormal or we need to change your treatment, we will call you or send a MyChart message to review the results.  Testing/Procedures: None ordered.  Follow-Up: At [facility], you and your health needs are our priority.  As part of our continuing mission to provide you with exceptional heart care, we have created designated Provider Care Teams.  These Care Teams include your primary Cardiologist (physician) and Advanced Practice Providers (APPs -  Physician Assistants and Nurse Practitioners) who all work together to provide you with the care you need, when you need it.   Your next appointment:   SVT ablation Nov 21st

## 2024-04-09 ENCOUNTER — Telehealth: Payer: Self-pay

## 2024-04-09 DIAGNOSIS — I471 Supraventricular tachycardia, unspecified: Secondary | ICD-10-CM

## 2024-04-09 NOTE — Telephone Encounter (Signed)
-----   Message from Nurse Corean PARAS sent at 03/30/2024 12:18 PM EDT ----- Regarding: 05/04/24 SVT GT  Precert:  MD: Waddell Type of ablation: SVT Diagnosis: SVT CPT code: SVT/WPW (06346) Ablation scheduled (date/time): Nov 21  Procedure:  Added to calendar? Yes Orders entered? No, >30 days before procedure Letter complete? No, >30 days before procedure Scheduled with cath lab? Yes Labs ordered (CBC, BMET, PT/INR if on warfarin): No Mapping system: CARTO (lab 4 or 6) CARTO/OPAL rep notified? Yes Cardiac CT needed? No Dye allergy? No Pre-meds ordered and instructions given? No, >30 days before procedure Letter method:  H&P: 03/12/24- 9:30 Device: No  Follow-up:  Cassie/Angel, please schedule Routine.  Covering RN - please send this message to [employer], EP scheduler, EP Scheduling pool, EP [employer], and CT scheduler (Brittany Lynch/Stephanie Mogg), if indicated. ----- Message ----- From: Radames Corean SAILOR, RN Sent: 03/30/2024  11:58 AM EDT To: Corean SAILOR Radames, RN  Important: list procedure date as first item in subject line, followed by procedure type (e.g., 02/24/24 AFib ablation)  Precert:  MD: Waddell Type of ablation: SVT Diagnosis: SVT CPT code: SVT/WPW (06346) Ablation scheduled (date/time): Nov 21  Procedure:  Added to calendar? Yes Orders entered? No, >30 days before procedure Letter complete? No, >30 days before procedure Scheduled with cath lab? Yes Labs ordered (CBC, BMET, PT/INR if on warfarin): No Mapping system: CARTO (lab 4 or 6) CARTO/OPAL rep notified? Yes Cardiac CT needed? No Dye allergy? No Pre-meds ordered and instructions given? No, >30 days before procedure Letter method:  H&P: 03/12/24 Device: No  Follow-up:  Cassie/Angel, please schedule Routine.  Covering RN - please send this message to [employer], EP scheduler, EP Scheduling pool, EP [employer], and CT scheduler (Brittany Lynch/Stephanie Mogg), if  indicated.

## 2024-04-11 ENCOUNTER — Other Ambulatory Visit: Payer: Self-pay

## 2024-04-11 ENCOUNTER — Ambulatory Visit
Admission: RE | Admit: 2024-04-11 | Discharge: 2024-04-11 | Disposition: A | Source: Ambulatory Visit | Attending: Adult Health | Admitting: Adult Health

## 2024-04-11 ENCOUNTER — Other Ambulatory Visit: Payer: Self-pay | Admitting: Adult Health

## 2024-04-11 ENCOUNTER — Ambulatory Visit
Admission: RE | Admit: 2024-04-11 | Discharge: 2024-04-11 | Disposition: A | Discharge status: Home / Self Care | Source: Ambulatory Visit | Attending: Adult Health | Admitting: Adult Health

## 2024-04-11 DIAGNOSIS — I471 Supraventricular tachycardia, unspecified: Secondary | ICD-10-CM

## 2024-04-11 DIAGNOSIS — C50412 Malignant neoplasm of upper-outer quadrant of left female breast: Secondary | ICD-10-CM

## 2024-04-11 HISTORY — DX: Malignant neoplasm of unspecified site of unspecified female breast: C50.919

## 2024-04-16 ENCOUNTER — Telehealth (HOSPITAL_COMMUNITY): Payer: Self-pay

## 2024-04-16 ENCOUNTER — Encounter (HOSPITAL_COMMUNITY): Payer: Self-pay

## 2024-04-16 NOTE — Telephone Encounter (Signed)
 Attempted to reach patient to discuss upcoming procedure, no answer. Left VM for patient to return call.

## 2024-04-19 LAB — CBC

## 2024-04-20 LAB — BASIC METABOLIC PANEL WITH GFR
BUN/Creatinine Ratio: 27 — ABNORMAL HIGH (ref 9–23)
BUN: 14 mg/dL (ref 6–24)
CO2: 21 mmol/L (ref 20–29)
Calcium: 9.2 mg/dL (ref 8.7–10.2)
Chloride: 100 mmol/L (ref 96–106)
Creatinine, Ser: 0.52 mg/dL — ABNORMAL LOW (ref 0.57–1.00)
Glucose: 83 mg/dL (ref 70–99)
Potassium: 4.5 mmol/L (ref 3.5–5.2)
Sodium: 138 mmol/L (ref 134–144)
eGFR: 112 mL/min/1.73 (ref >59)

## 2024-04-20 LAB — CBC
Hematocrit: 40.4 % (ref 34.0–46.6)
Hemoglobin: 13.1 g/dL (ref 11.1–15.9)
MCH: 31.6 pg (ref 26.6–33.0)
MCHC: 32.4 g/dL (ref 31.5–35.7)
MCV: 97 fL (ref 79–97)
Platelets: 335 x10E3/uL (ref 150–450)
RBC: 4.15 x10E6/uL (ref 3.77–5.28)
RDW: 12.3 % (ref 11.7–15.4)
WBC: 5.9 x10E3/uL (ref 3.4–10.8)

## 2024-05-03 NOTE — Pre-Procedure Instructions (Signed)
Attempted to call patient regarding procedure instructions.  Left voicemail on the following items: Arrival time 0730 Nothing to eat or drink after midnight No meds AM of procedure Responsible person to drive you home and stay with you for 24 hrs     

## 2024-05-04 ENCOUNTER — Encounter (HOSPITAL_COMMUNITY)
Admission: RE | Disposition: A | Discharge status: Home / Self Care | Payer: Self-pay | Source: Home / Self Care | Attending: Internal Medicine

## 2024-05-04 ENCOUNTER — Encounter (HOSPITAL_COMMUNITY): Payer: Self-pay | Admitting: Internal Medicine

## 2024-05-04 ENCOUNTER — Other Ambulatory Visit: Payer: Self-pay

## 2024-05-04 ENCOUNTER — Ambulatory Visit (HOSPITAL_COMMUNITY)
Admission: RE | Admit: 2024-05-04 | Discharge: 2024-05-04 | Disposition: A | Discharge status: Home / Self Care | Attending: Internal Medicine | Admitting: Internal Medicine

## 2024-05-04 DIAGNOSIS — Z79899 Other long term (current) drug therapy: Secondary | ICD-10-CM | POA: Insufficient documentation

## 2024-05-04 DIAGNOSIS — I4719 Other supraventricular tachycardia: Secondary | ICD-10-CM | POA: Insufficient documentation

## 2024-05-04 DIAGNOSIS — I471 Supraventricular tachycardia, unspecified: Secondary | ICD-10-CM

## 2024-05-04 DIAGNOSIS — Z853 Personal history of malignant neoplasm of breast: Secondary | ICD-10-CM | POA: Insufficient documentation

## 2024-05-04 DIAGNOSIS — F17210 Nicotine dependence, cigarettes, uncomplicated: Secondary | ICD-10-CM | POA: Diagnosis not present

## 2024-05-04 HISTORY — PX: SVT ABLATION: EP1225

## 2024-05-04 LAB — PREGNANCY, URINE: Preg Test, Ur: NEGATIVE

## 2024-05-04 SURGERY — SVT ABLATION

## 2024-05-04 MED ORDER — SODIUM CHLORIDE 0.9 % IV SOLN: INTRAVENOUS | Status: DC

## 2024-05-04 MED ORDER — ACETAMINOPHEN 325 MG PO TABS: 650.0000 mg | ORAL_TABLET | ORAL | Status: DC | PRN

## 2024-05-04 MED ORDER — BUPIVACAINE HCL (PF) 0.25 % IJ SOLN
INTRAMUSCULAR | Status: DC | PRN
Start: 2024-05-04 — End: 2024-05-04
  Administered 2024-05-04: 30 mL

## 2024-05-04 MED ORDER — HEPARIN (PORCINE) IN NACL 1000-0.9 UT/500ML-% IV SOLN
INTRAVENOUS | Status: DC | PRN
  Administered 2024-05-04: 500 mL

## 2024-05-04 MED ORDER — MIDAZOLAM HCL 5 MG/5ML IJ SOLN
INTRAMUSCULAR | Status: AC
  Filled 2024-05-04: qty 5

## 2024-05-04 MED ORDER — MIDAZOLAM HCL 5 MG/5ML IJ SOLN
INTRAMUSCULAR | Status: DC | PRN
  Administered 2024-05-04 (×2): 1 mg via INTRAVENOUS
  Administered 2024-05-04: 2 mg via INTRAVENOUS
  Administered 2024-05-04: 1 mg via INTRAVENOUS

## 2024-05-04 MED ORDER — FENTANYL CITRATE (PF) 100 MCG/2ML IJ SOLN
INTRAMUSCULAR | Status: AC
  Filled 2024-05-04: qty 2

## 2024-05-04 MED ORDER — FENTANYL CITRATE (PF) 100 MCG/2ML IJ SOLN
INTRAMUSCULAR | Status: DC | PRN
  Administered 2024-05-04: 25 ug via INTRAVENOUS
  Administered 2024-05-04 (×3): 12.5 ug via INTRAVENOUS

## 2024-05-04 MED ORDER — BUPIVACAINE HCL (PF) 0.25 % IJ SOLN
INTRAMUSCULAR | Status: AC
  Filled 2024-05-04: qty 30

## 2024-05-04 MED ORDER — MIDAZOLAM HCL 5 MG/5ML IJ SOLN
INTRAMUSCULAR | Status: AC
Start: 2024-05-04 — End: 2024-05-04
  Filled 2024-05-04: qty 5

## 2024-05-04 MED ORDER — HEPARIN SODIUM (PORCINE) 1000 UNIT/ML IJ SOLN
INTRAMUSCULAR | Status: AC
  Filled 2024-05-04: qty 10

## 2024-05-04 MED ORDER — ONDANSETRON HCL 4 MG/2ML IJ SOLN
4.0000 mg | Freq: Four times a day (QID) | INTRAMUSCULAR | Status: DC | PRN
Start: 2024-05-04 — End: 2024-05-04

## 2024-05-04 MED ORDER — SODIUM CHLORIDE 0.9% FLUSH: 3.0000 mL | INTRAVENOUS | Status: DC | PRN

## 2024-05-04 MED ORDER — SODIUM CHLORIDE 0.9 % IV SOLN
250.0000 mL | INTRAVENOUS | Status: DC | PRN
Start: 2024-05-04 — End: 2024-05-04

## 2024-05-04 SURGICAL SUPPLY — 13 items
CATH EZ STEER NAV 4MM D-F CUR (ABLATOR) IMPLANT
CATH JOSEPH QUAD ALLRED 6F REP (CATHETERS) IMPLANT
CATH POLARIS X 2.5/5/2.5 DECAP (CATHETERS) IMPLANT
CLOSURE PERCLOSE PROSTYLE (Vascular Products) IMPLANT
PACK EP LF (CUSTOM PROCEDURE TRAY) ×1 IMPLANT
PAD DEFIB RADIO PHYSIO CONN (PAD) ×1 IMPLANT
PATCH CARTO3 (PAD) IMPLANT
SHEATH PINNACLE 6F 10CM (SHEATH) IMPLANT
SHEATH PINNACLE 7F 10CM (SHEATH) IMPLANT
SHEATH PINNACLE 8F 10CM (SHEATH) IMPLANT
SHEATH PROBE COVER 6X72 (BAG) IMPLANT
TUBING SMART ABLATE COOLFLOW (TUBING) IMPLANT
WIRE MICRO SET SILHO 5FR 7 (SHEATH) IMPLANT

## 2024-05-04 NOTE — Interval H&P Note (Signed)
 History and Physical Interval Note:  05/04/2024 10:20 AM  Heidi CHRISTELLA Fisherman  has presented today for surgery, with the diagnosis of svt.  The various methods of treatment have been discussed with the patient and family. After consideration of risks, benefits and other options for treatment, the patient has consented to  Procedure(s): SVT ABLATION (N/A) as a surgical intervention.  The patient's history has been reviewed, patient examined, no change in status, stable for surgery.  I have reviewed the patient's chart and labs.  Questions were answered to the patient's satisfaction.     Donnice DELENA Primus

## 2024-05-04 NOTE — H&P (Signed)
 HPI Heidi Nolan is referred by Dr. [facility] for evaluation of SVT. She is a pleasant 53 yo woman with a h/o breast Ca, who has developed palpitations and found to have SVT over the past few years. The patient notes episodes lasting hours and has required IV adenosine  for SVT over 200/min. She has not had syncope. They stop and start suddenly. She has taken metoprolol  with variable success.  Allergies  No Known Allergies         Current Outpatient Medications  Medication Sig Dispense Refill   amphetamine-dextroamphetamine (ADDERALL) 20 MG tablet Take 20 mg by mouth 3 (three) times daily.       betamethasone dipropionate 0.05 % cream Apply 1 Application topically daily as needed (skin irritation/psoriasis).       BLACK CURRANT SEED OIL PO Take 1 drop by mouth daily.       Cholecalciferol (VITAMIN D3) 50 MCG (2000 UT) TABS Take 2,000 Units by mouth in the morning.       famotidine (PEPCID) 20 MG tablet Take 20 mg by mouth in the morning.       loratadine (CLARITIN) 10 MG tablet Take 10 mg by mouth in the morning.       metoprolol  tartrate (LOPRESSOR ) 25 MG tablet Take 1 tablet (25 mg total) by mouth once as needed for up to 1 dose. Take this tab as needed if you feel palpitations and your heart rate is elevated 15 tablet 0   montelukast (SINGULAIR) 10 MG tablet Take 10 mg by mouth in the morning.   3   Multiple Vitamins-Minerals (VITAMIN-MINERAL SUPPLEMENT PO) Take 2 drops by mouth daily. [place] Tail Oil Supplement       Probiotic Product (PROBIOTIC PO) Take 1 capsule by mouth in the morning.       tamoxifen  (NOLVADEX ) 20 MG tablet Take 1 tablet (20 mg total) by mouth daily. 90 tablet 3   Tapinarof (VTAMA EX) Apply 1 Application topically daily.       venlafaxine  XR (EFFEXOR -XR) 37.5 MG 24 hr capsule Take 1 capsule (37.5 mg total) by mouth daily with breakfast. 90 capsule 3   VENTOLIN HFA 108 (90 BASE) MCG/ACT inhaler Inhale 2 puffs into the lungs every 4 (four) hours as needed for wheezing.           No current facility-administered medications for this visit.          Past Medical History:  Diagnosis Date   Abnormal Pap smear of cervix     ADHD (attention deficit hyperactivity disorder)     Arthritis      hips and back   Cancer of left breast (HCC)     Cervical disc disease     SVT (supraventricular tachycardia)            ROS:    All systems reviewed and negative except as noted in the HPI.          Past Surgical History:  Procedure Laterality Date   AXILLARY LYMPH NODE BIOPSY Left 05/04/2023    Procedure: LEFT AXILLARY SENTINEL LYMPH NODE BIOPSY;  Surgeon: Ebbie Cough, MD;  Location: [facility] OR;  Service: General;  Laterality: Left;   BREAST BIOPSY Left 04/08/2023    US  LT BREAST BX W LOC DEV 1ST LESION IMG BX SPEC US  GUIDE 04/08/2023 [facility]MO[facility]HY   BREAST BIOPSY   05/03/2023    MM LT RADIOACTIVE SEED LOC MAMMO GUIDE 05/03/2023 [facility]MO[facility]   BREAST LUMPECTOMY WITH RADIOACTIVE SEED LOCALIZATION  Left 05/04/2023    Procedure: LEFT BREAST SEED BRACKETED LUMPECTOMY;  Surgeon: Ebbie Cough, MD;  Location: [facility] OR;  Service: General;  Laterality: Left;   CESAREAN SECTION   1991   DILATATION & CURRETTAGE/HYSTEROSCOPY WITH RESECTOCOPE N/A 10/27/2023    Procedure: HYSTEROSCOPY;  Surgeon: Horacio Boas, MD;  Location: [facility] OR;  Service: Gynecology;  Laterality: N/A;  Myosure   MYOSURE RESECTION N/A 10/27/2023    Procedure: MELINDA RESECTION OF MYOMA;  Surgeon: Horacio Boas, MD;  Location: [facility] OR;  Service: Gynecology;  Laterality: N/A;   NECK SURGERY   9/14    [facility]             Family History  Problem Relation Age of Onset   Osteoarthritis Mother     Heart attack Father 77        Died of MI   Colon cancer Maternal Aunt          dx >50   Colon cancer Maternal Aunt          dx <50   Throat cancer Paternal Uncle          dx >50; or lung?   Breast cancer Maternal Grandmother          dx 79s   Lung cancer Paternal Grandfather          dx >50         Social History         Socioeconomic History   Marital status: Married      Spouse name: Not on file   Number of children: 1   Years of education: Not on file   Highest education level: Not on file  Occupational History   Not on file  Tobacco Use   Smoking status: Every Day      Current packs/day: 0.50      Average packs/day: 0.5 packs/day for 30.0 years (15.0 ttl pk-yrs)      Types: Cigarettes   Smokeless tobacco: Never   Tobacco comments:      1/2 PPD  Vaping Use   Vaping status: Never Used  Substance and Sexual Activity   Alcohol use: Yes      Alcohol/week: 5.0 - 7.0 standard drinks of alcohol      Types: 5 - 7 Standard drinks or equivalent per week      Comment: 1-2 glasses of wine at night   Drug use: No   Sexual activity: Yes      Partners: Male  Other Topics Concern   Not on file  Social History Narrative    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      Social Drivers of Acupuncturist Strain: Not on file  Food Insecurity: No Food Insecurity (04/20/2023)    Hunger Vital Sign     Worried About Running Out of Food in the Last Year: Never true     Ran Out of Food in the Last Year: Never true  Transportation Needs: No Transportation Needs (04/20/2023)    PRAPARE - Therapist, Art (Medical): No     Lack of Transportation (Non-Medical): No  Physical Activity: Not on file  Stress: Not on file  Social Connections: Not on file  Intimate Partner Violence: Not At Risk (04/20/2023)    Humiliation, Afraid, Rape, and Kick questionnaire     Fear of Current or Ex-Partner: No  Emotionally Abused: No     Physically Abused: No     Sexually Abused: No    BP (!) 143/90   Pulse 72   Temp 98.6 F (37 C) (Oral)   Resp 17   Ht 5' 5 (1.651 m)   Wt 70.3 kg   SpO2 99%   BMI 25.79 kg/m   Physical Exam:   General: well appearing, no acute distress Cardiac: RRR, no M/G/R Pulmonary: CTAB, no W/R/R   EKG  - nsr with no pre-excitation   Assess/Plan: SVT - I have discussed the treatment options with the patient and the risks/benefits/goals/expectations of EP study and catheter ablation were reviewed and she wishes to proceed.

## 2024-05-04 NOTE — Progress Notes (Signed)
 Discharge instructions reviewed by previous care nurse. Patient continues to Denies questions or concerns. PT tolerated PO intake. Ambulated in the hallway, was able to void without difficulty. Seen by MD. Incision site remains clean dry and intact. No s/s of complications. PT escorted from the unit via wheel chair to personal vehicle.

## 2024-05-04 NOTE — Discharge Instructions (Signed)
 Cardiac Ablation, Care After  This sheet gives you information about how to care for yourself after your procedure. Your health care provider may also give you more specific instructions. If you have problems or questions, contact your health care provider. What can I expect after the procedure? After the procedure, it is common to have: Bruising around your puncture site. Tenderness around your puncture site. Skipped heartbeats. If you had an atrial fibrillation ablation, you may have atrial fibrillation during the first several months after your procedure.  Tiredness (fatigue).  Follow these instructions at home: Puncture site care  Follow instructions from your health care provider about how to take care of your puncture site. Make sure you: If present, leave stitches (sutures), skin glue, or adhesive strips in place. These skin closures may need to stay in place for up to 2 weeks. If adhesive strip edges start to loosen and curl up, you may trim the loose edges. Do not remove adhesive strips completely unless your health care provider tells you to do that. If a large square bandage is present, this may be removed 24 hours after surgery.  Check your puncture site every day for signs of infection. Check for: Redness, swelling, or pain. Fluid or blood. If your puncture site starts to bleed, lie down on your back, apply firm pressure to the area, and contact your health care provider. Warmth. Pus or a bad smell. A pea or marble sized lump/knot at the site is normal and can take up to three months to resolve.  Driving Do not drive for at least 4 days after your procedure or however long your health care provider recommends. (Do not resume driving if you have previously been instructed not to drive for other health reasons.) Do not drive or use heavy machinery while taking prescription pain medicine. Activity Avoid activities that take a lot of effort for at least 7 days after your  procedure. Do not lift anything that is heavier than 5 lb (4.5 kg) for one week.  No sexual activity for 1 week.  Return to your normal activities as told by your health care provider. Ask your health care provider what activities are safe for you. General instructions Take over-the-counter and prescription medicines only as told by your health care provider. Do not use any products that contain nicotine or tobacco, such as cigarettes and e-cigarettes. If you need help quitting, ask your health care provider. You may shower after 24 hours, but Do not take baths, swim, or use a hot tub for 1 week.  Do not drink alcohol for 24 hours after your procedure. Keep all follow-up visits as told by your health care provider. This is important. Contact a health care provider if: You have redness, mild swelling, or pain around your puncture site. You have fluid or blood coming from your puncture site that stops after applying firm pressure to the area. Your puncture site feels warm to the touch. You have pus or a bad smell coming from your puncture site. You have a fever. You have chest pain or discomfort that spreads to your neck, jaw, or arm. You have chest pain that is worse with lying on your back or taking a deep breath. You are sweating a lot. You feel nauseous. You have a fast or irregular heartbeat. You have shortness of breath. You are dizzy or light-headed and feel the need to lie down. You have pain or numbness in the arm or leg closest to your puncture site.  Get help right away if: Your puncture site suddenly swells. Your puncture site is bleeding and the bleeding does not stop after applying firm pressure to the area. These symptoms may represent a serious problem that is an emergency. Do not wait to see if the symptoms will go away. Get medical help right away. Call your local emergency services (911 in the [place] Do not drive yourself to the hospital. Summary After the procedure, it  is normal to have bruising and tenderness at the puncture site in your groin, neck, or forearm. Check your puncture site every day for signs of infection. Get help right away if your puncture site is bleeding and the bleeding does not stop after applying firm pressure to the area. This is a medical emergency. This information is not intended to replace advice given to you by your health care provider. Make sure you discuss any questions you have with your health care provider.   Femoral Site Care This sheet gives you information about how to care for yourself after your procedure. Your health care provider may also give you more specific instructions. If you have problems or questions, contact your health care provider. What can I expect after the procedure?  After the procedure, it is common to have: Bruising that usually fades within 1-2 weeks. Tenderness at the site. Follow these instructions at home: Wound care Follow instructions from your health care provider about how to take care of your insertion site. Make sure you: Wash your hands with soap and water before you change your bandage (dressing). If soap and water are not available, use hand sanitizer. Remove your dressing as told by your health care provider. In 24 hours Do not take baths, swim, or use a hot tub until your health care provider approves. You may shower 24-48 hours after the procedure or as told by your health care provider. Gently wash the site with plain soap and water. Pat the area dry with a clean towel. Do not rub the site. This may cause bleeding. Do not apply powder, lotion, cream, perfume to the site. Keep the site clean and dry. Check your femoral site every day for signs of infection. Check for: Redness, swelling, or pain. Fluid or blood. Warmth. Pus or a bad smell. Activity For the first 2-3 days after your procedure, or as long as directed: Avoid climbing stairs as much as possible. Do not squat. Do  not lift anything that is heavier than 5 lbs, or the limit that you are told, until your health care provider says that it is safe. For 7 days Rest as directed. Avoid sitting for a long time without moving. Get up to take short walks every 1-2 hours. Do not drive for 4 Days if you were given a medicine to help you relax (sedative). General instructions Take over-the-counter and prescription medicines only as told by your health care provider. Keep all follow-up visits as told by your health care provider. This is important. Contact a health care provider if you have: A fever or chills. You have redness, swelling, or pain around your insertion site. Get help right away if: The catheter insertion area swells very fast. You pass out. You suddenly start to sweat or your skin gets clammy. The catheter insertion area is bleeding, and the bleeding does not stop when you hold steady pressure on the area. The area near or just beyond the catheter insertion site becomes pale, cool, tingly, or numb. These symptoms may represent a serious  problem that is an emergency. Do not wait to see if the symptoms will go away. Get medical help right away. Call your local emergency services (911 in the [place] Do not drive yourself to the hospital. Summary After the procedure, it is common to have bruising that usually fades within 1-2 weeks. Check your femoral site every day for signs of infection. Do not lift anything that is heavier than 5 lbs, or the limit that you are told, until your health care provider says that it is safe. This information is not intended to replace advice given to you by your health care provider. Make sure you discuss any questions you have with your health care provider. Document Revised: 06/13/2017 Document Reviewed: 06/13/2017 Elsevier Patient Education  2020 [employer].

## 2024-05-04 NOTE — Progress Notes (Signed)
 Care taken over at 1648 from Nurse Kate

## 2024-05-05 ENCOUNTER — Encounter (HOSPITAL_COMMUNITY): Payer: Self-pay | Admitting: Internal Medicine

## 2024-05-07 ENCOUNTER — Ambulatory Visit: Attending: General Surgery

## 2024-05-07 ENCOUNTER — Telehealth (HOSPITAL_COMMUNITY): Payer: Self-pay

## 2024-05-07 VITALS — Wt 156.5 lb

## 2024-05-07 DIAGNOSIS — Z483 Aftercare following surgery for neoplasm: Secondary | ICD-10-CM | POA: Insufficient documentation

## 2024-05-07 NOTE — Therapy (Signed)
 OUTPATIENT PHYSICAL THERAPY SOZO SCREENING NOTE   Patient Name: Heidi Nolan MRN: [id number] DOB:01-Dec-1970, 53 y.o., female Today's Date: 05/07/2024  PCP: Marvene Prentice SAUNDERS, FNP REFERRING PROVIDER: Ebbie Cough, MD   PT End of Session - 05/07/24 1525     Visit Number 1   # unchanged due to screen only   PT Start Time 1523    PT Stop Time 1527    PT Time Calculation (min) 4 min    Activity Tolerance Patient tolerated treatment well    Behavior During Therapy WFL for tasks assessed/performed          Past Medical History:  Diagnosis Date   Abnormal Pap smear of cervix    ADHD (attention deficit hyperactivity disorder)    Arthritis    hips and back   Breast cancer (HCC)    Cancer of left breast (HCC)    Cervical disc disease    SVT (supraventricular tachycardia)    Past Surgical History:  Procedure Laterality Date   AXILLARY LYMPH NODE BIOPSY Left 05/04/2023   Procedure: LEFT AXILLARY SENTINEL LYMPH NODE BIOPSY;  Surgeon: Ebbie Cough, MD;  Location: [facility] OR;  Service: General;  Laterality: Left;   BREAST BIOPSY Left 04/08/2023   US  LT BREAST BX W LOC DEV 1ST LESION IMG BX SPEC US  GUIDE 04/08/2023 [facility] MAMMOGRAPHY   BREAST BIOPSY  05/03/2023   MM LT RADIOACTIVE SEED LOC MAMMO GUIDE 05/03/2023 [facility] MAMMOGRAPHY   BREAST LUMPECTOMY WITH RADIOACTIVE SEED LOCALIZATION Left 05/04/2023   Procedure: LEFT BREAST SEED BRACKETED LUMPECTOMY;  Surgeon: Ebbie Cough, MD;  Location[facility];  Service: General;  Laterality: Left;   CESAREAN SECTION  1991   DILATATION & CURRETTAGE/HYSTEROSCOPY WITH RESECTOCOPE N/A 10/27/2023   Procedure: HYSTEROSCOPY;  Surgeon: Horacio Boas, MD;  Location: [facility] OR;  Service: Gynecology;  Laterality: N/A;  Myosure   MYOSURE RESECTION N/A 10/27/2023   Procedure: MELINDA RESECTION OF MYOMA;  Surgeon: Horacio Boas, MD;  Location: [facility] OR;  Service: Gynecology;  Laterality: N/A;   NECK SURGERY  9/14   [facility]   SVT ABLATION N/A 05/04/2024    Procedure: SVT ABLATION;  Surgeon: Waddell Danelle ORN, MD;  Location: [facility] INVASIVE CV LAB;  Service: Cardiovascular;  Laterality: N/A;   Patient Active Problem List   Diagnosis Date Noted   Genetic testing 05/02/2023   Malignant neoplasm of upper-outer quadrant of left breast in female, estrogen receptor positive (HCC) 04/18/2023   HSV (herpes simplex virus) anogenital infection 09/01/2014   Rectal fissure 09/01/2014   Cervical spinal stenosis 03/01/2013    REFERRING DIAG: left breast cancer at risk for lymphedema  THERAPY DIAG: Aftercare following surgery for neoplasm  PERTINENT HISTORY: Patient was diagnosed on 03/09/2023 with 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 She underwent a left lumpectomy and sentinel node biopsy (1 of 2 nodes positive for carcinoma) on 05/04/2023. It is ER/PR positive and HER2 negative with a Ki67 of 5%. Completed radiation    PRECAUTIONS: left UE Lymphedema risk,   SUBJECTIVE: Here for 3 month SOZO screen  PAIN:  Are you having pain? No  SOZO SCREENING: Patient was assessed today using the SOZO machine to determine the lymphedema index score. This was compared to her baseline score. It was determined that she is within the recommended range when compared to her baseline and no further action is needed at this time. She will continue SOZO screenings. These are done every 3 months for 2 years post operatively followed by every 6 months  for 2 years, and then annually.   L-DEX FLOWSHEETS - 05/07/24 1500       L-DEX LYMPHEDEMA SCREENING   Measurement Type Unilateral    L-DEX MEASUREMENT EXTREMITY Upper Extremity    POSITION  Standing    DOMINANT SIDE Right    At Risk Side Left    BASELINE SCORE (UNILATERAL) 0.4    L-DEX SCORE (UNILATERAL) 3.2    VALUE CHANGE (UNILAT) 2.8         P: Cont every 3 month L-Dex screen until 04/2025, then can transition to 6 months until 04/2027.   Aden Berwyn Caldron, PTA 05/07/2024, 3:26 PM

## 2024-05-07 NOTE — Telephone Encounter (Signed)
 Attempted to reach patient to follow up with procedure completed on 05/04/24, no answer. Left VM for patient to return call.

## 2024-05-21 ENCOUNTER — Telehealth: Payer: Self-pay

## 2024-05-21 NOTE — Telephone Encounter (Signed)
 S/w patient regarding rescheduling tomorrow's appointment with Dr. Odean d/t weather concerns. Patient rescheduled for a telephone visit instead.  Confirmed appointment time and date with patient. Appointment updated.

## 2024-05-22 ENCOUNTER — Inpatient Hospital Stay: Admitting: Hematology and Oncology

## 2024-05-22 ENCOUNTER — Inpatient Hospital Stay: Attending: Hematology and Oncology | Admitting: Hematology and Oncology

## 2024-05-22 DIAGNOSIS — N951 Menopausal and female climacteric states: Secondary | ICD-10-CM

## 2024-05-22 DIAGNOSIS — C50412 Malignant neoplasm of upper-outer quadrant of left female breast: Secondary | ICD-10-CM | POA: Insufficient documentation

## 2024-05-22 DIAGNOSIS — Z1721 Progesterone receptor positive status: Secondary | ICD-10-CM | POA: Insufficient documentation

## 2024-05-22 DIAGNOSIS — Z79899 Other long term (current) drug therapy: Secondary | ICD-10-CM | POA: Insufficient documentation

## 2024-05-22 DIAGNOSIS — Z170 Estrogen receptor positive status [ER+]: Secondary | ICD-10-CM | POA: Diagnosis not present

## 2024-05-22 DIAGNOSIS — Z1732 Human epidermal growth factor receptor 2 negative status: Secondary | ICD-10-CM | POA: Diagnosis not present

## 2024-05-22 DIAGNOSIS — Z923 Personal history of irradiation: Secondary | ICD-10-CM | POA: Diagnosis not present

## 2024-05-22 DIAGNOSIS — R635 Abnormal weight gain: Secondary | ICD-10-CM | POA: Diagnosis not present

## 2024-05-22 NOTE — Assessment & Plan Note (Signed)
 04/08/2023:Screening mammogram detected left breast 2 masses total measuring 2.6 cm (1.5 cm and 1.1 cm) biopsy revealed grade 1 IDC ER 100% PR 100% HER2 negative, Ki-67 5%  05/04/2023: Left lumpectomy: Grade 1 IDC 2.1 cm with DCIS, margins negative, LVI not identified, 1/2 lymph nodes positive with extracapsular extension, ER 60%, PR 70%, HER2 negative, Ki-67 5% Oncotype DX recurrence score: 20    Treatment plan: Oncotype DX: 20 (no role of chemotherapy) Adjuvant radiation therapy 06/17/23-08/03/23 Adjuvant antiestrogen therapy --------------------------------------------------------------------------------------------------------------------  Tamoxifen  toxicities:  Breast cancer surveillance: Breast exam 05/22/2024: Benign Mammogram 04/11/2024 with ultrasound: Benign breast density category C  SVT: Ablation 05/04/2024 Return to clinic in 1 year for follow-up

## 2024-05-22 NOTE — Progress Notes (Signed)
 HEMATOLOGY-ONCOLOGY TELEPHONE VISIT PROGRESS NOTE  I connected with our patient on 05/22/24 at  8:30 AM EST by telephone and verified that I am speaking with the correct person using two identifiers.  I discussed the limitations, risks, security and privacy concerns of performing an evaluation and management service by telephone and the availability of in person appointments.  I also discussed with the patient that there may be a patient responsible charge related to this service. The patient expressed understanding and agreed to proceed.   History of Present Illness:    History of Present Illness Heidi Nolan is a 53 year old female with Stage IA, ER+/PR+, HER2- invasive ductal carcinoma of the left breast, status post lumpectomy and adjuvant radiation, currently on tamoxifen , who presents for oncology follow-up and management of menopausal symptoms related to endocrine therapy.  She completed adjuvant radiation to the left breast on August 03, 2023, and started tamoxifen  in March 2025. She remains adherent without missed doses. Her October 2025 mammogram was unremarkable.  She has severe vasomotor symptoms, mainly nocturnal hot flashes, which are partially improved on venlafaxine  75 mg daily. She also notes weight gain that she attributes to tamoxifen  or seasonal factors and is concerned about this side effect.  She has had amenorrhea since September 2024 with no menstrual periods for over a year. After fibroid removal earlier in 2025 she had only brief spotting and no further vaginal bleeding. She is uncertain of her menopausal status.  She underwent cardiac ablation for arrhythmia in November 2025 and currently has no palpitations, tachycardia, or other cardiovascular symptoms, and feels less stressed about her cardiac status.  Aug 03, 2023: Follow-up visit after completion of adjuvant radiation therapy for Stage IA, ER+/PR+ left breast IDC. Radiation was well tolerated without  significant fatigue. Counseling provided regarding initiation of tamoxifen  scheduled for August 17, 2023, with discussion of risks and benefits; consideration for future switch to anastrozole once menopause is confirmed.    Oncology History  Malignant neoplasm of upper-outer quadrant of left breast in female, estrogen receptor positive (HCC)  04/08/2023 Initial Diagnosis   Screening mammogram detected left breast 2 masses total measuring 2.6 cm (1.5 cm and 1.1 cm) biopsy revealed grade 1 IDC ER 100% PR 100% HER2 negative, Ki-67 5%   04/20/2023 Cancer Staging   Staging form: Breast, AJCC 8th Edition - Clinical stage from 04/20/2023: Stage IA (cT1c, cN0, cM0, G1, ER+, PR+, HER2-) - Signed by Lanell Donald Stagger, PA-C on 04/20/2023 Stage prefix: Initial diagnosis Histologic grading system: 3 grade system   05/02/2023 Genetic Testing   Negative [facility] CancerNext+RNAinsight Panel.  Report date is 05/02/2023.   The [employer] CancerNext+RNAinsight Panel includes sequencing, rearrangement analysis, and RNA analysis for the following 34 genes: APC, ATM, BARD1, BMPR1A, BRCA1, BRCA2, BRIP1, CDH1, CDK4, CDKN2A, CHEK2, DICER1, MLH1, MSH2, MSH6, MUTYH, NF1, NTHL1, PALB2, PMS2, PTEN, RAD51C, RAD51D, SMAD4, SMARCA4, STK11 and TP53 (sequencing and deletion/duplication); AXIN2, HOXB13, MSH3, POLD1 and POLE (sequencing only); EPCAM and GREM1 (deletion/duplication only).    05/04/2023 Surgery   Left lumpectomy: Grade 1 IDC 2.1 cm with DCIS, margins negative, LVI not identified, 1/2 lymph nodes positive with extracapsular extension, ER 60%, PR 70%, HER2 negative, Ki-67 5%    05/04/2023 Oncotype testing   20/9%   05/31/2023 Cancer Staging   Staging form: Breast, AJCC 8th Edition - Pathologic: Stage IA (pT2, pN1a, cM0, G1, ER+, PR+, HER2-, Oncotype DX score: 20) - Signed by Odean Potts, MD on 05/31/2023 Multigene prognostic tests performed: Oncotype DX Recurrence score  range: Greater than or equal to  11 Histologic grading system: 3 grade system   06/16/2023 - 08/03/2023 Radiation Therapy   Plan Name: Breast_L_BH Site: Breast, Left Technique: 3D Mode: Photon Dose Per Fraction: 1.8 Gy Prescribed Dose (Delivered / Prescribed): 50.4 Gy / 50.4 Gy Prescribed Fxs (Delivered / Prescribed): 28 / 28   Plan Name: SCV_L_Brst_BH Site: Sclav-LT Technique: 3D Mode: Photon Dose Per Fraction: 1.8 Gy Prescribed Dose (Delivered / Prescribed): 50.4 Gy / 50.4 Gy Prescribed Fxs (Delivered / Prescribed): 28 / 28   Plan Name: Brst_L_Bst_BH Site: Breast, Left Technique: 3D Mode: Photon Dose Per Fraction: 2 Gy Prescribed Dose (Delivered / Prescribed): 10 Gy / 10 Gy Prescribed Fxs (Delivered / Prescribed): 5 / 5   08/2023 -  Anti-estrogen oral therapy   Tamoxifen      REVIEW OF SYSTEMS:   Constitutional: Denies fevers, chills or abnormal weight loss All other systems were reviewed with the patient and are negative. Observations/Objective:     Assessment Plan:  Malignant neoplasm of upper-outer quadrant of left breast in female, estrogen receptor positive (HCC) 04/08/2023:Screening mammogram detected left breast 2 masses total measuring 2.6 cm (1.5 cm and 1.1 cm) biopsy revealed grade 1 IDC ER 100% PR 100% HER2 negative, Ki-67 5%  05/04/2023: Left lumpectomy: Grade 1 IDC 2.1 cm with DCIS, margins negative, LVI not identified, 1/2 lymph nodes positive with extracapsular extension, ER 60%, PR 70%, HER2 negative, Ki-67 5% Oncotype DX recurrence score: 20    Treatment plan: Oncotype DX: 20 (no role of chemotherapy) Adjuvant radiation therapy 06/17/23-08/03/23 Adjuvant antiestrogen therapy started March 2025 --------------------------------------------------------------------------------------------------------------------  Tamoxifen  toxicities: Hot flashes: on Effexor : Improved (still has at night time) Weight gain  Last menstrual cycle was Sept 2024 I would like to check [facility] and estradiol levels  tomorrow If she is in menopause we would like to switch her to anastrozole and consider adding CDK 4 and 6 inhibitor.  She could also be evaluated for wider clinical trial.  Breast cancer surveillance: Breast exam : Benign Mammogram 04/11/2024 with ultrasound: Benign breast density category C  SVT: Ablation 05/04/2024 (no further issues) Return to clinic in 1 year for follow-up   Assessment & Plan Estrogen receptor positive malignant neoplasm of the upper-outer quadrant of the left breast Post-lumpectomy, adjuvant radiation, and tamoxifen . Tamoxifen -induced vasomotor symptoms managed with venlafaxine . Weight gain likely multifactorial. Menopausal status confirmation needed for potential switch to anastrozole and clinical trial eligibility due to lymph node involvement. - Continued tamoxifen  therapy pending menopausal status confirmation. - Continued venlafaxine  75 mg daily for vasomotor symptoms. - Discussed potential switch to anastrozole if menopause confirmed; advised review of anastrozole information. - Discussed eligibility for novel agents and clinical trials for lymph node positive, ER+ disease; revisit post-menopausal status confirmation.  Menopausal state Amenorrhea since September 2024 and minimal bleeding post-fibroid removal suggest menopause; awaiting laboratory confirmation to guide endocrine therapy. - Ordered laboratory tests to confirm menopausal status; scheduled for May 23, 2024 at 2 PM. - Plan to review laboratory results in ten days and discuss endocrine therapy implications, including possible switch to anastrozole if menopause confirmed.      I discussed the assessment and treatment plan with the patient. The patient was provided an opportunity to ask questions and all were answered. The patient agreed with the plan and demonstrated an understanding of the instructions. The patient was advised to call back or seek an in-person evaluation if the symptoms worsen  or if the condition fails to improve as anticipated.   I  provided 20 minutes of non-face-to-face time during this encounter.  This includes time for charting and coordination of care   Naomi MARLA Chad, MD

## 2024-05-23 ENCOUNTER — Inpatient Hospital Stay

## 2024-05-23 DIAGNOSIS — Z1721 Progesterone receptor positive status: Secondary | ICD-10-CM | POA: Diagnosis not present

## 2024-05-23 DIAGNOSIS — Z79899 Other long term (current) drug therapy: Secondary | ICD-10-CM | POA: Diagnosis not present

## 2024-05-23 DIAGNOSIS — Z170 Estrogen receptor positive status [ER+]: Secondary | ICD-10-CM

## 2024-05-23 DIAGNOSIS — Z1732 Human epidermal growth factor receptor 2 negative status: Secondary | ICD-10-CM | POA: Diagnosis not present

## 2024-05-23 DIAGNOSIS — C50412 Malignant neoplasm of upper-outer quadrant of left female breast: Secondary | ICD-10-CM | POA: Diagnosis present

## 2024-05-24 ENCOUNTER — Telehealth: Payer: Self-pay

## 2024-05-24 DIAGNOSIS — Z170 Estrogen receptor positive status [ER+]: Secondary | ICD-10-CM

## 2024-05-24 LAB — FOLLICLE STIMULATING HORMONE: FSH: 32.9 m[IU]/mL

## 2024-05-24 LAB — ESTRADIOL: Estradiol: 11.3 pg/mL

## 2024-05-24 NOTE — Telephone Encounter (Signed)
 [id number] [facility] Research Study  Patient Heidi Nolan was identified by Dr. Gudena as a potential candidate for the above listed study.  This Clinical Research Coordinator spoke with MAILEY LANDSTROM, [id number], via phone in a manner that ensures patient privacy to discuss participation in the above listed research study. A copy of the informed consent document and separate HIPAA Authorization was provided to the patient via email.  Patient reads, speaks, and understands English.   Patient was provided with the contact information of this Coordinator and encouraged to contact the research team with any questions. Approximately 10 minutes were spent with the patient reviewing the informed consent documents. Patient was encouraged to review at their convenience with their support network, including other care providers. Will follow patient on study interest.  Laury Quale, MPH  Clinical Research Coordinator

## 2024-05-30 ENCOUNTER — Telehealth: Payer: Self-pay

## 2024-05-30 DIAGNOSIS — C50412 Malignant neoplasm of upper-outer quadrant of left female breast: Secondary | ICD-10-CM

## 2024-05-30 NOTE — Telephone Encounter (Signed)
 [id number]: Harnessing E-Mindfulness Approaches for Living-After Breast Cancer ([facility])   Called to follow up with patient via phone on study interest. Left voicemail. Patient has contact information if she has any questions. Will follow patient on study interest.  Laury Quale, MPH  Clinical Research Coordinator

## 2024-06-04 ENCOUNTER — Ambulatory Visit: Admitting: Pulmonary Disease

## 2024-06-05 ENCOUNTER — Telehealth: Payer: Self-pay

## 2024-06-05 DIAGNOSIS — Z170 Estrogen receptor positive status [ER+]: Secondary | ICD-10-CM

## 2024-06-05 NOTE — Telephone Encounter (Signed)
 [id number]: Harnessing E-Mindfulness Approaches for Living--After Breast Cancer ([facility])   Patient declined mindfulness study. Patient has contact information if she has any questions.  Laury Quale, MPH  Clinical Research Coordinator

## 2024-06-05 NOTE — Telephone Encounter (Signed)
 DCP-001: Use of a Clinical Trial Screening Tool to Address Cancer Health Disparities in the [facility] Oncology Research Program [facility])   Patient Heidi Nolan was identified by Dr. Gudena as a potential candidate for the above listed study.  This Clinical Research Coordinator spoke with Heidi Nolan, [id number], on via phone in a manner that ensures patient privacy to discuss participation in the above listed research study.  Patient was previously provided with the business card of this Coordinator and encouraged to contact the research team with any questions.  Approximately 5 minutes were spent with the patient reviewing the informed consent documents.  Patient was encouraged to review at their convenience with their support network, including other care providers. Patient is comfortable with making a decision regarding study participation today. Patient declined questionnaire.  Laury Quale, MPH  Clinical Research Coordinator

## 2024-06-06 ENCOUNTER — Inpatient Hospital Stay: Admitting: Hematology and Oncology

## 2024-06-06 NOTE — Progress Notes (Unsigned)
" °  Electrophysiology Office Note:   Date:  06/08/2024  ID:  THAYER INABINET, DOB 02-Jul-1970, MRN [id number]  Primary Cardiologist: None Primary Heart Failure: None Electrophysiologist: Donnice DELENA Primus, MD      History of Present Illness:   Heidi Nolan is a 53 y.o. female with h/o SVT, breast CA seen today for routine electrophysiology follow-up s/p SVT Ablation.  Admitted 04/2024 for SVT 200 bpm that required adenosine .  She had tried metoprolol  with variable success. Episodes started & stopped on their own. 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   Since last being seen in our clinic the patient reports doing very well.  She had no issues with her groin sites post procedure. She has not had any further episodes of fast HR's.     She denies chest pain, palpitations, dyspnea, PND, orthopnea, nausea, vomiting, dizziness, syncope, edema, weight gain, or early satiety.    Review of systems complete and found to be negative unless listed in HPI.   EP Information / Studies Reviewed:    EKG is ordered today. Personal review as below.  EKG Interpretation Date/Time:  Friday June 08 2024 11:11:51 EST Ventricular Rate:  74 PR Interval:  144 QRS Duration:  84 QT Interval:  396 QTC Calculation: 439 R Axis:   84  Text Interpretation: Normal sinus rhythm Confirmed by Aniceto Jarvis ([id number]) on 06/08/2024 11:32:26 AM    Arrhythmia / AAD / Pertinent EP Studies SVT  EPS 05/04/24 > SR on presentation, pt had dual AV nodal physiology with easilty inducible classic AV nodal reentrant tachycardia, there were no other accessory pathways or arrhythmias induced, s/p RF modification of the slow pathway  Physical Exam:   VS:  BP (!) 140/86 (BP Location: Right Arm, Patient Position: Sitting, Cuff Size: Large)   Pulse 74   Ht 5' 5 (1.651 m)   Wt 160 lb 3.2 oz (72.7 kg)   SpO2 95%   BMI 26.66 kg/m    Wt Readings from  Last 3 Encounters:  06/08/24 160 lb 3.2 oz (72.7 kg)  05/07/24 156 lb 8 oz (71 kg)  05/04/24 155 lb (70.3 kg)     GEN: Well nourished, well developed in no acute distress NECK: No JVD; No carotid bruits CARDIAC: Regular rate and rhythm, no murmurs, rubs, gallops RESPIRATORY:  Clear to auscultation without rales, wheezing or rhonchi  ABDOMEN: Soft, non-tender, non-distended EXTREMITIES:  No edema; No deformity   ASSESSMENT AND PLAN:    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 Primus / EP APP  in 6 months    Signed, Jarvis Aniceto, NP-C, AGACNP-BC [facility] - Electrophysiology  06/08/2024, 11:33 AM  "

## 2024-06-08 ENCOUNTER — Ambulatory Visit: Attending: Pulmonary Disease | Admitting: Pulmonary Disease

## 2024-06-08 ENCOUNTER — Encounter: Payer: Self-pay | Admitting: Pulmonary Disease

## 2024-06-08 VITALS — BP 140/86 | HR 74 | Ht 65.0 in | Wt 160.2 lb

## 2024-06-08 DIAGNOSIS — I471 Supraventricular tachycardia, unspecified: Secondary | ICD-10-CM

## 2024-06-08 NOTE — Patient Instructions (Signed)
 Medication Instructions:  NO CHANGES *If you need a refill on your cardiac medications before your next appointment, please call your pharmacy*  Lab Work: NO LABS If you have labs (blood work) drawn today and your tests are completely normal, you will receive your results only by: MyChart Message (if you have MyChart) OR A paper copy in the mail If you have any lab test that is abnormal or we need to change your treatment, we will call you to review the results.  Testing/Procedures: NO TESTING  Follow-Up: At [facility], you and your health needs are our priority.  As part of our continuing mission to provide you with exceptional heart care, our providers are all part of one team.  This team includes your primary Cardiologist (physician) and Advanced Practice Providers or APPs (Physician Assistants and Nurse Practitioners) who all work together to provide you with the care you need, when you need it.  Your next appointment:   6 month(s)  Provider:   Daphne Barrack, NP   Other Instructions

## 2024-06-25 ENCOUNTER — Inpatient Hospital Stay: Attending: Hematology and Oncology | Admitting: Hematology and Oncology

## 2024-06-25 VITALS — BP 139/77 | HR 80 | Temp 98.1°F | Resp 16 | Wt 158.9 lb

## 2024-06-25 DIAGNOSIS — Z79899 Other long term (current) drug therapy: Secondary | ICD-10-CM | POA: Diagnosis not present

## 2024-06-25 DIAGNOSIS — Z170 Estrogen receptor positive status [ER+]: Secondary | ICD-10-CM | POA: Insufficient documentation

## 2024-06-25 DIAGNOSIS — Z1732 Human epidermal growth factor receptor 2 negative status: Secondary | ICD-10-CM | POA: Insufficient documentation

## 2024-06-25 DIAGNOSIS — C773 Secondary and unspecified malignant neoplasm of axilla and upper limb lymph nodes: Secondary | ICD-10-CM | POA: Diagnosis present

## 2024-06-25 DIAGNOSIS — Z1721 Progesterone receptor positive status: Secondary | ICD-10-CM | POA: Diagnosis not present

## 2024-06-25 DIAGNOSIS — C50412 Malignant neoplasm of upper-outer quadrant of left female breast: Secondary | ICD-10-CM | POA: Diagnosis not present

## 2024-06-25 MED ORDER — ANASTROZOLE 1 MG PO TABS: 1.0000 mg | ORAL_TABLET | Freq: Every day | ORAL | 3 refills | Status: AC

## 2024-06-25 NOTE — Progress Notes (Signed)
 "  Patient Care Team: Marvene Prentice SAUNDERS, FNP as PCP - General (Family Medicine) Almetta Donnice LABOR, MD as PCP - Electrophysiology (Cardiology) Ebbie Donnice, MD as Consulting Physician (General Surgery) Odean Potts, MD as Consulting Physician (Hematology and Oncology) Dewey Rush, MD as Consulting Physician (Radiation Oncology)  DIAGNOSIS:  Encounter Diagnosis  Name Primary?   Malignant neoplasm of upper-outer quadrant of left breast in female, estrogen receptor positive (HCC) Yes    SUMMARY OF ONCOLOGIC HISTORY: Oncology History  Malignant neoplasm of upper-outer quadrant of left breast in female, estrogen receptor positive (HCC)  04/08/2023 Initial Diagnosis   Screening mammogram detected left breast 2 masses total measuring 2.6 cm (1.5 cm and 1.1 cm) biopsy revealed grade 1 IDC ER 100% PR 100% HER2 negative, Ki-67 5%   04/20/2023 Cancer Staging   Staging form: Breast, AJCC 8th Edition - Clinical stage from 04/20/2023: Stage IA (cT1c, cN0, cM0, G1, ER+, PR+, HER2-) - Signed by Lanell Donald Stagger, PA-C on 04/20/2023 Stage prefix: Initial diagnosis Histologic grading system: 3 grade system   05/02/2023 Genetic Testing   Negative [employer] CancerNext+RNAinsight Panel.  Report date is 05/02/2023.   The [employer] CancerNext+RNAinsight Panel includes sequencing, rearrangement analysis, and RNA analysis for the following 34 genes: APC, ATM, BARD1, BMPR1A, BRCA1, BRCA2, BRIP1, CDH1, CDK4, CDKN2A, CHEK2, DICER1, MLH1, MSH2, MSH6, MUTYH, NF1, NTHL1, PALB2, PMS2, PTEN, RAD51C, RAD51D, SMAD4, SMARCA4, STK11 and TP53 (sequencing and deletion/duplication); AXIN2, HOXB13, MSH3, POLD1 and POLE (sequencing only); EPCAM and GREM1 (deletion/duplication only).    05/04/2023 Surgery   Left lumpectomy: Grade 1 IDC 2.1 cm with DCIS, margins negative, LVI not identified, 1/2 lymph nodes positive with extracapsular extension, ER 60%, PR 70%, HER2 negative, Ki-67 5%    05/04/2023 Oncotype testing    20/9%   05/31/2023 Cancer Staging   Staging form: Breast, AJCC 8th Edition - Pathologic: Stage IA (pT2, pN1a, cM0, G1, ER+, PR+, HER2-, Oncotype DX score: 20) - Signed by Odean Potts, MD on 05/31/2023 Multigene prognostic tests performed: Oncotype DX Recurrence score range: Greater than or equal to 11 Histologic grading system: 3 grade system   06/16/2023 - 08/03/2023 Radiation Therapy   Plan Name: Breast_L_BH Site: Breast, Left Technique: 3D Mode: Photon Dose Per Fraction: 1.8 Gy Prescribed Dose (Delivered / Prescribed): 50.4 Gy / 50.4 Gy Prescribed Fxs (Delivered / Prescribed): 28 / 28   Plan Name: SCV_L_Brst_BH Site: Sclav-LT Technique: 3D Mode: Photon Dose Per Fraction: 1.8 Gy Prescribed Dose (Delivered / Prescribed): 50.4 Gy / 50.4 Gy Prescribed Fxs (Delivered / Prescribed): 28 / 28   Plan Name: Brst_L_Bst_BH Site: Breast, Left Technique: 3D Mode: Photon Dose Per Fraction: 2 Gy Prescribed Dose (Delivered / Prescribed): 10 Gy / 10 Gy Prescribed Fxs (Delivered / Prescribed): 5 / 5   08/2023 -  Anti-estrogen oral therapy   Tamoxifen      CHIEF COMPLIANT: Follow-up to discuss menopause labs and discuss change in treatment  HISTORY OF PRESENT ILLNESS:  History of Present Illness ELBERT POLYAKOV is a 54 year old woman with stage IA, ER+/PR+, 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  She completed lumpectomy and adjuvant radiation for left breast invasive ductal carcinoma with associated DCIS and is on tamoxifen  for secondary prevention.  She has severe vasomotor symptoms on tamoxifen , mainly nocturnal diaphoresis, describing that she sweats all night. She is now postmenopausal with absent menses and no vaginal bleeding.  She had a flu-like illness over the holidays  that fully resolved and has no current acute medical issues affecting today's oncology  care.      ALLERGIES:  has no known allergies.  MEDICATIONS:  Current Outpatient Medications  Medication Sig Dispense Refill   amphetamine-dextroamphetamine (ADDERALL) 20 MG tablet Take 20 mg by mouth 3 (three) times daily.     betamethasone dipropionate 0.05 % cream Apply 1 Application topically daily as needed (skin irritation/psoriasis). (Patient not taking: Reported on 06/08/2024)     BLACK CURRANT SEED OIL PO Take 1 drop by mouth daily.     cholecalciferol (VITAMIN D3) 25 MCG (1000 UNIT) tablet Take 1,000 Units by mouth in the morning.     famotidine (PEPCID) 20 MG tablet Take 20 mg by mouth in the morning.     loratadine (CLARITIN) 10 MG tablet Take 10 mg by mouth in the morning.     montelukast (SINGULAIR) 10 MG tablet Take 10 mg by mouth in the morning.  3   Multiple Vitamins-Minerals (VITAMIN-MINERAL SUPPLEMENT PO) Take 1 drop by mouth daily. Turkey Tail Oil Supplement     OVER THE COUNTER MEDICATION Take 1 tablet by mouth daily. Zen[employer] Digestive Enzymes     tamoxifen  (NOLVADEX ) 20 MG tablet Take 1 tablet (20 mg total) by mouth daily. 90 tablet 3   Tapinarof (VTAMA EX) Apply 1 Application topically daily as needed (psoriasis).     venlafaxine  XR (EFFEXOR -XR) 75 MG 24 hr capsule Take 75 mg by mouth daily with breakfast.     VENTOLIN HFA 108 (90 BASE) MCG/ACT inhaler Inhale 2 puffs into the lungs every 4 (four) hours as needed for wheezing.     vitamin B-12 (CYANOCOBALAMIN) 500 MCG tablet Take 500 mcg by mouth daily.     No current facility-administered medications for this visit.    PHYSICAL EXAMINATION: ECOG PERFORMANCE STATUS: 1 - Symptomatic but completely ambulatory  Vitals:   06/25/24 0824  BP: 139/77  Pulse: 80  Resp: 16  Temp: 98.1 F (36.7 C)  SpO2: 100%   Filed Weights   06/25/24 0824  Weight: 158 lb 14.4 oz (72.1 kg)    LABORATORY DATA:  I have reviewed the data as listed    Latest Ref Rng & Units 04/19/2024    8:32 AM 04/20/2023   12:05 PM 01/27/2023     1:18 PM  CMP  Glucose 70 - 99 mg/dL 83  95  91   BUN 6 - 24 mg/dL 14  21  14    Creatinine 0.57 - 1.00 mg/dL 9.47  9.45  9.41   Sodium 134 - 144 mmol/L 138  137  136   Potassium 3.5 - 5.2 mmol/L 4.5  4.0  3.6   Chloride 96 - 106 mmol/L 100  102  104   CO2 20 - 29 mmol/L 21  31  21    Calcium 8.7 - 10.2 mg/dL 9.2  9.4  8.8   Total Protein 6.5 - 8.1 g/dL  7.0    Total Bilirubin <1.2 mg/dL  0.5    Alkaline Phos 38 - 126 U/L  51    AST 15 - 41 U/L  17    ALT 0 - 44 U/L  19      Lab Results  Component Value Date   WBC 5.9 04/19/2024   HGB 13.1 04/19/2024   HCT 40.4 04/19/2024   MCV 97 04/19/2024   PLT 335 04/19/2024   NEUTROABS 3.6 04/20/2023    ASSESSMENT & PLAN:  Malignant neoplasm of upper-outer quadrant of left  breast in female, estrogen receptor positive (HCC) 04/08/2023:Screening mammogram detected left breast 2 masses total measuring 2.6 cm (1.5 cm and 1.1 cm) biopsy revealed grade 1 IDC ER 100% PR 100% HER2 negative, Ki-67 5%  05/04/2023: Left lumpectomy: Grade 1 IDC 2.1 cm with DCIS, margins negative, LVI not identified, 1/2 lymph nodes positive with extracapsular extension, ER 60%, PR 70%, HER2 negative, Ki-67 5% Oncotype DX recurrence score: 20    Treatment plan: Oncotype DX: 20 (no role of chemotherapy) Adjuvant radiation therapy 06/17/23-08/03/23 Adjuvant antiestrogen therapy started March 2025 --------------------------------------------------------------------------------------------------------------------  Tamoxifen  toxicities: Hot flashes: on Effexor : Improved (still has at night time) Weight gain   Last menstrual cycle was Sept 2024 I would like to check [facility] and estradiol  levels tomorrow If she is in menopause we would like to switch her to anastrozole  and consider adding CDK 4 and 6 inhibitor.  She could also be evaluated for wider clinical trial.   Breast cancer surveillance: Breast exam 06/25/2024: Benign Mammogram 04/11/2024 with ultrasound: Benign  breast density category C   SVT: Ablation 05/04/2024 (no further issues) Menopause labs: 05/23/2024: FSH 32.9, estradiol  11.3 Based upon the above results patient is considered menopausal.  Recommendation: Switch to aromatase inhibitor therapy. Consider participation in wider clinical trial.  I provided the information to our clinical research team to give her a call as soon as a trial is open Telephone visit in 2 months to assess tolerance to anastrozole  therapy   No orders of the defined types were placed in this encounter.  The patient has a good understanding of the overall plan. she agrees with it. she will call with any problems that may develop before the next visit here.  I personally spent a total of 30 minutes in the care of the patient today including preparing to see the patient, getting/reviewing separately obtained history, performing a medically appropriate exam/evaluation, counseling and educating, placing orders, referring and communicating with other health care professionals, documenting clinical information in the EHR, independently interpreting results, communicating results, and coordinating care.   Viinay K Careen Mauch, MD 06/25/2024    "

## 2024-06-25 NOTE — Assessment & Plan Note (Signed)
 04/08/2023:Screening mammogram detected left breast 2 masses total measuring 2.6 cm (1.5 cm and 1.1 cm) biopsy revealed grade 1 IDC ER 100% PR 100% HER2 negative, Ki-67 5%  05/04/2023: Left lumpectomy: Grade 1 IDC 2.1 cm with DCIS, margins negative, LVI not identified, 1/2 lymph nodes positive with extracapsular extension, ER 60%, PR 70%, HER2 negative, Ki-67 5% Oncotype DX recurrence score: 20    Treatment plan: Oncotype DX: 20 (no role of chemotherapy) Adjuvant radiation therapy 06/17/23-08/03/23 Adjuvant antiestrogen therapy started March 2025 --------------------------------------------------------------------------------------------------------------------  Tamoxifen  toxicities: Hot flashes: on Effexor : Improved (still has at night time) Weight gain   Last menstrual cycle was Sept 2024 I would like to check [facility] and estradiol  levels tomorrow If she is in menopause we would like to switch her to anastrozole  and consider adding CDK 4 and 6 inhibitor.  She could also be evaluated for wider clinical trial.   Breast cancer surveillance: Breast exam 06/25/2024: Benign Mammogram 04/11/2024 with ultrasound: Benign breast density category C   SVT: Ablation 05/04/2024 (no further issues) Menopause labs: 05/23/2024: FSH 32.9, estradiol  11.3 Based upon the above results patient is considered menopausal.  Recommendation: Switch to aromatase inhibitor therapy. Consider participation in wider clinical trial

## 2024-06-26 ENCOUNTER — Telehealth: Payer: Self-pay | Admitting: Hematology and Oncology

## 2024-06-26 NOTE — Telephone Encounter (Signed)
 Left vm for pt about scheduled telephone f/u visit date and time. Encouraged to call if need to reschedule.

## 2024-08-06 ENCOUNTER — Ambulatory Visit: Attending: General Surgery

## 2024-08-23 ENCOUNTER — Inpatient Hospital Stay: Attending: Hematology and Oncology | Admitting: Hematology and Oncology

## 2024-11-16 ENCOUNTER — Inpatient Hospital Stay: Attending: Hematology and Oncology

## 2024-11-16 ENCOUNTER — Ambulatory Visit: Admitting: Adult Health
